# Patient Record
Sex: Female | Born: 1965 | Race: White | Hispanic: No | Marital: Married | Smoking: Never smoker
Health system: Southern US, Community
[De-identification: ages and names within clinical notes are randomized; demographics above are authoritative.]

## PROBLEM LIST (undated history)

## (undated) DIAGNOSIS — R112 Nausea with vomiting, unspecified: Secondary | ICD-10-CM

## (undated) DIAGNOSIS — Z9889 Other specified postprocedural states: Secondary | ICD-10-CM

## (undated) DIAGNOSIS — F419 Anxiety disorder, unspecified: Secondary | ICD-10-CM

## (undated) DIAGNOSIS — Z90710 Acquired absence of both cervix and uterus: Secondary | ICD-10-CM

## (undated) DIAGNOSIS — R519 Headache, unspecified: Secondary | ICD-10-CM

## (undated) DIAGNOSIS — R51 Headache: Secondary | ICD-10-CM

## (undated) HISTORY — PX: BLADDER SURGERY: SHX569

## (undated) HISTORY — PX: NECK SURGERY: SHX720

## (undated) HISTORY — PX: CHOLECYSTECTOMY: SHX55

## (undated) HISTORY — PX: DILATION AND CURETTAGE OF UTERUS: SHX78

## (undated) HISTORY — PX: SHOULDER ARTHROSCOPY: SHX128

---

## 2006-08-18 ENCOUNTER — Encounter: Admission: RE | Admit: 2006-08-18 | Discharge: 2006-08-18 | Payer: Self-pay | Admitting: Obstetrics and Gynecology

## 2007-08-21 ENCOUNTER — Encounter: Admission: RE | Admit: 2007-08-21 | Discharge: 2007-08-21 | Payer: Self-pay | Admitting: Obstetrics & Gynecology

## 2008-08-15 ENCOUNTER — Encounter: Admission: RE | Admit: 2008-08-15 | Discharge: 2008-08-15 | Payer: Self-pay | Admitting: Anesthesiology

## 2008-08-21 ENCOUNTER — Encounter: Admission: RE | Admit: 2008-08-21 | Discharge: 2008-08-21 | Payer: Self-pay | Admitting: Obstetrics & Gynecology

## 2008-09-13 ENCOUNTER — Ambulatory Visit (HOSPITAL_COMMUNITY): Admission: RE | Admit: 2008-09-13 | Discharge: 2008-09-13 | Payer: Self-pay | Admitting: Neurological Surgery

## 2008-10-15 ENCOUNTER — Encounter: Admission: RE | Admit: 2008-10-15 | Discharge: 2008-10-15 | Payer: Self-pay | Admitting: Neurological Surgery

## 2008-12-16 ENCOUNTER — Encounter: Admission: RE | Admit: 2008-12-16 | Discharge: 2008-12-16 | Payer: Self-pay | Admitting: Neurological Surgery

## 2009-03-15 ENCOUNTER — Encounter: Admission: RE | Admit: 2009-03-15 | Discharge: 2009-03-15 | Payer: Self-pay | Admitting: Neurological Surgery

## 2009-03-17 ENCOUNTER — Encounter: Admission: RE | Admit: 2009-03-17 | Discharge: 2009-03-17 | Payer: Self-pay | Admitting: Neurological Surgery

## 2009-09-15 ENCOUNTER — Encounter: Admission: RE | Admit: 2009-09-15 | Discharge: 2009-09-15 | Payer: Self-pay | Admitting: Obstetrics & Gynecology

## 2010-04-28 ENCOUNTER — Encounter: Admission: RE | Admit: 2010-04-28 | Discharge: 2010-04-28 | Payer: Self-pay | Admitting: Internal Medicine

## 2010-06-12 IMAGING — CR DG CERVICAL SPINE 1V
1 series · 1 of 1 positions shown · non-contrast
Comparison: [HOSPITAL] cervical spine radiograph
09/13/2008 and [HOSPITAL] at [REDACTED] [HOSPITAL] cervical spine
radiograph 10/15/2008.

CLINICAL DATA: Surgery 09/13/2008 with neck pain.

CERVICAL SPINE - 1 VIEW

[w c-spine lat]
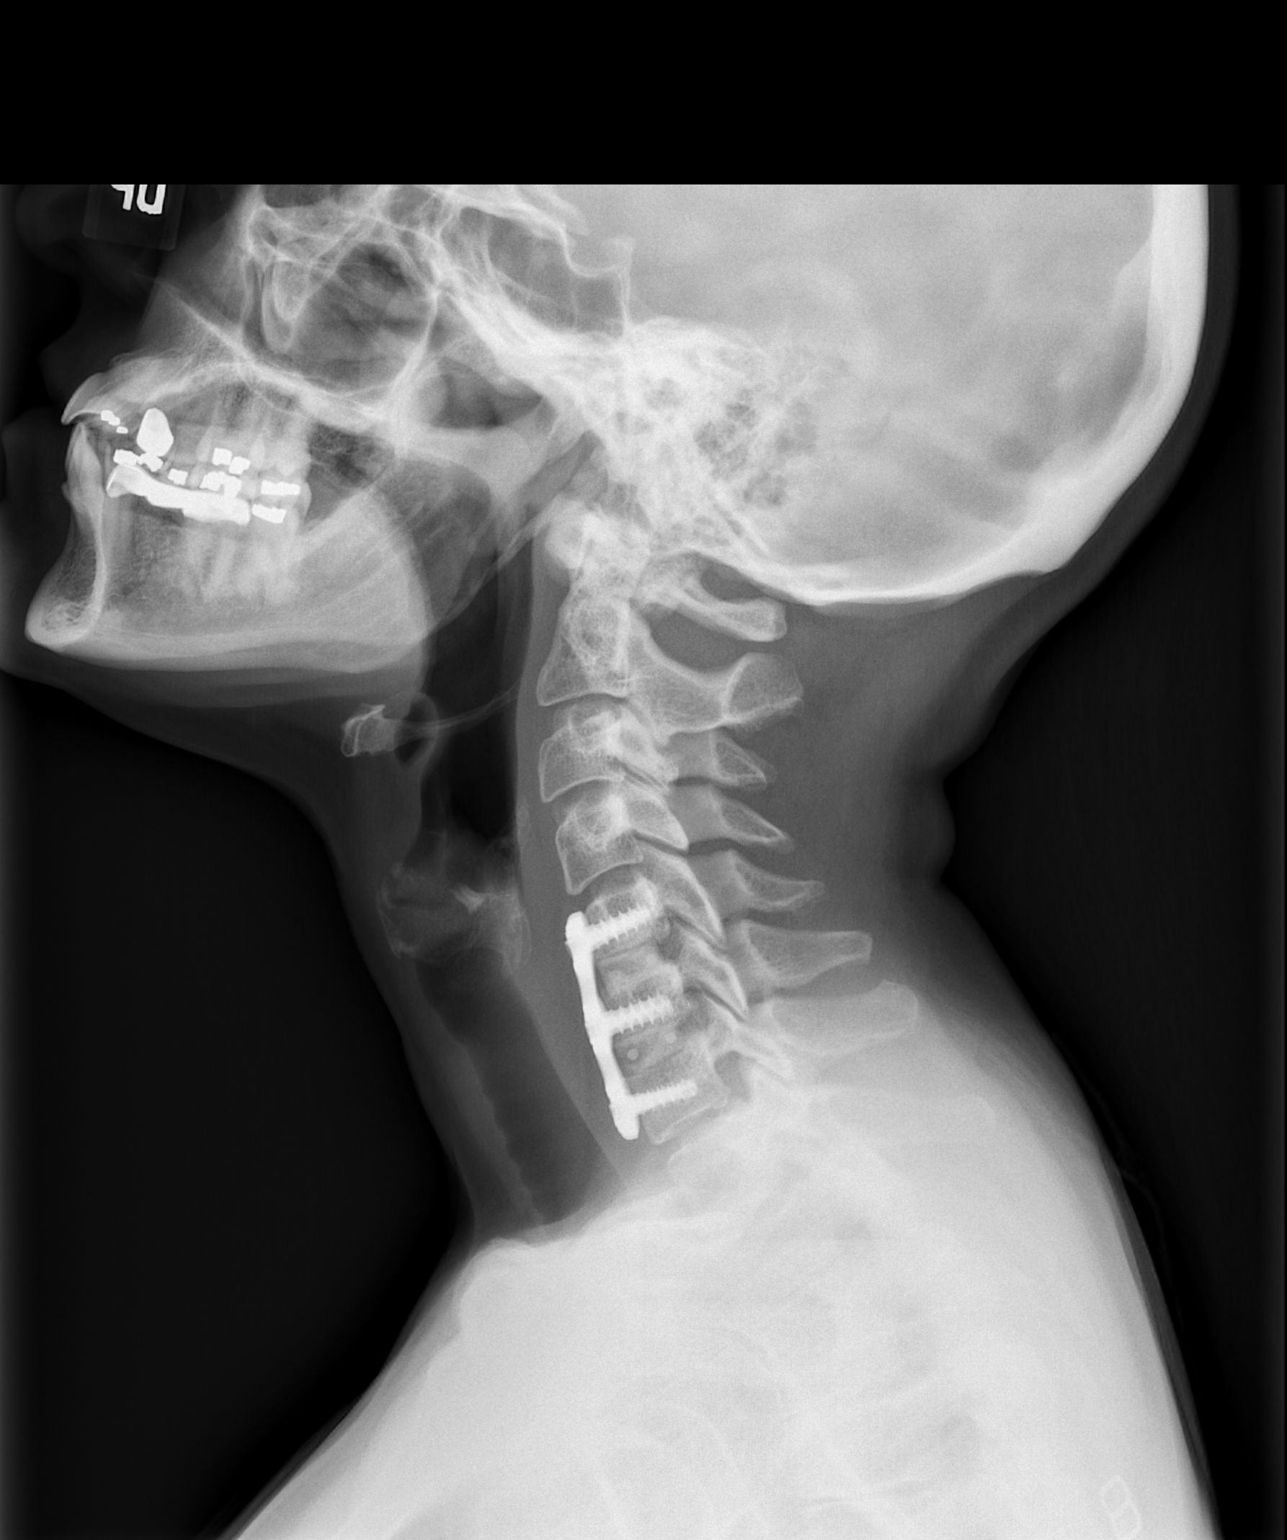

[1 of 1 positions shown; findings below may reference images not displayed]

FINDINGS: Stable satisfactory appearing anterior cervical
discectomy fusion hardware and interbody bone plugs C5-C7
visualized.  Remaining cervical disc spaces and vertebral alignment
normally maintained.  No abnormal prevertebral soft tissue swelling
or new acute findings visualized.
IMPRESSION: 1.  Stable satisfactory appearing anterior cervical discectomy
fusion C5-C7.
2.  No acute findings.

## 2010-09-21 ENCOUNTER — Encounter
Admission: RE | Admit: 2010-09-21 | Discharge: 2010-09-21 | Payer: Self-pay | Source: Home / Self Care | Attending: Obstetrics & Gynecology | Admitting: Obstetrics & Gynecology

## 2010-12-16 ENCOUNTER — Other Ambulatory Visit: Payer: Self-pay | Admitting: Internal Medicine

## 2010-12-16 DIAGNOSIS — R1011 Right upper quadrant pain: Secondary | ICD-10-CM

## 2010-12-18 ENCOUNTER — Ambulatory Visit
Admission: RE | Admit: 2010-12-18 | Discharge: 2010-12-18 | Disposition: A | Discharge status: Home / Self Care | Payer: 59 | Source: Ambulatory Visit | Attending: Internal Medicine | Admitting: Internal Medicine

## 2010-12-18 DIAGNOSIS — R1011 Right upper quadrant pain: Secondary | ICD-10-CM

## 2011-02-16 NOTE — Op Note (Signed)
Brianna Cook, Brianna Cook             ACCOUNT NO.:  [id number]   MEDICAL RECORD NO.:  [id number]          PATIENT TYPE:  OIB   LOCATION:  3526                         FACILITY:  [facility]   PHYSICIAN:  Tia Alert, MD     DATE OF BIRTH:  [birth year]   DATE OF PROCEDURE:  09/13/2008  DATE OF DISCHARGE:  09/13/2008                               OPERATIVE REPORT   PREOPERATIVE DIAGNOSES:  Cervical spondylosis with degenerative disk  disease and spinal stenosis, C5-6 and C6-7 with neck and left arm pain.   POSTOPERATIVE DIAGNOSES:  Cervical spondylosis with degenerative disk  disease and spinal stenosis, C5-6 and C6-7 with neck and left arm pain.   PROCEDURE:  1. Decompressive anterior cervical diskectomy, C5-6, C6-7 for central      canal and left C6 and C7 nerve root decompression.  2. Anterior cervical arthrodesis, C5-6 and C6-7 utilizing 7-mm      corticocancellous allograft.  3. Anterior cervical plating, C5-C7 utilizing a 40-mm Atlantis Venture      plate.   SURGEON:  Tia Alert, MD   ASSISTANT:  Reinaldo Meeker, MD   ANESTHESIA:  General endotracheal.   COMPLICATIONS:  None apparent.   INDICATIONS FOR PROCEDURE:  Ms. Riedlinger is a very pleasant 45 year old  female who is referred with neck and left arm pain.  She had an MRI,  which showed significant spondylosis with degenerative disk disease and  neural foraminal stenosis as well as central canal stenosis at C5-6 and  C6-7.  We discussed treatment options.  She wished to proceed with ACDF  and plating at C5-6, C6-7 in hopes of improving her pain syndrome.  She  understood the risks, the benefits and expected outcome, and wished to  proceed.   DESCRIPTION OF PROCEDURE:  The patient was taken to the operating room  and after induction of adequate generalized endotracheal anesthesia, she  was placed in a supine position on the operating room table.  Her right  anterior cervical region was prepped with DuraPrep and draped  in usual  sterile fashion.  A 5 mL of local anesthesia was injected and a  transverse incision was made to the right midline and carried down to  the platysma, which was elevated, opened, and undermined with Metzenbaum  scissors.  I then dissected the plane medial to the sternocleidomastoid  muscle and internal carotid artery and lateral to the trachea and  esophagus to expose C5-6 and C6-7.  Intraoperative fluoroscopy confirmed  my level and then the longus colli muscles were taken down and the  Shadow-Line retractors were placed under this to expose C5-6, C6-7.  The  annulus at these levels were incised and an initial diskectomy was done  with pituitary rongeurs and curved Karlin curettes.  I then used a high-  speed drill to prepare the endplates by drilling the endplates down to  the level of the posterior longitudinal ligament.  The height of the  disk space was heightened with the drill to 7 mm.  I then drilled in a  rectangular fashion, I then brought in the operating microscope and I  opened the posterior longitudinal ligament with a nerve hook at each  level and then used the Kerrison punch to undercut the bodies of C5-C6  and C6-C7 at the 2 levels.  I angled the microscope up and down to look  up under the bodies as best I could and bite up under each vertebral  body until the dura was full and capacious all the way across.  I  performed foraminotomies on the patient's left side, identified the C6  and the C7 nerve roots, by marching along the pedicle, identifying the  nerve root, and decompressing it distally into its neural foramina.  I  then palpated with nerve hook in a circumferential fashion to assure  adequate decompression of the central canal and the left-sided nerve  roots.  I could see the cord pulsatile through the dura.  I then  irrigated with saline solution and measured the interspace to be 7 mm  and I used 7-mm corticocancellous allografts and tapped these into   position at C5-6 and C6-7.  I then used a 40-mm Atlantis Venture plate  and placed two 13-mm variable angle screws in the bodies of C5, C6, and  C7, and then locked these into plate by locking mechanism within the  plate.  I then irrigated with saline solution containing bacitracin,  dried all bleeding points with bipolar cautery and I irrigated the  surgery foam away.  Once meticulous hemostasis was achieved, I closed  the platysma with 3-0 Vicryl, with the subcuticular tissue with 3-0  Vicryl, and the skin with Benzoin and Steri-Strips.  The drapes removed.  Sterile dressing was applied.  The patient was awakened from general  anesthesia and transferred to recovery room in stable addition.  At the  end of the procedure, all sponge, needle, and instrument counts were  correct.       Tia Alert, MD  Electronically Signed     DSJ/MEDQ  D:  09/13/2008  T:  09/14/2008  Job:  [id number]

## 2011-04-22 ENCOUNTER — Ambulatory Visit (HOSPITAL_COMMUNITY)
Admission: RE | Admit: 2011-04-22 | Discharge: 2011-04-22 | Disposition: A | Discharge status: Home / Self Care | Payer: 59 | Source: Ambulatory Visit | Attending: Gastroenterology | Admitting: Gastroenterology

## 2011-04-22 ENCOUNTER — Other Ambulatory Visit: Payer: Self-pay | Admitting: Gastroenterology

## 2011-04-22 DIAGNOSIS — N200 Calculus of kidney: Secondary | ICD-10-CM | POA: Insufficient documentation

## 2011-04-22 DIAGNOSIS — D126 Benign neoplasm of colon, unspecified: Secondary | ICD-10-CM | POA: Insufficient documentation

## 2011-04-22 DIAGNOSIS — R197 Diarrhea, unspecified: Secondary | ICD-10-CM | POA: Insufficient documentation

## 2011-04-22 DIAGNOSIS — R109 Unspecified abdominal pain: Secondary | ICD-10-CM | POA: Insufficient documentation

## 2011-04-22 DIAGNOSIS — Z9089 Acquired absence of other organs: Secondary | ICD-10-CM | POA: Insufficient documentation

## 2011-05-06 NOTE — Op Note (Signed)
  NAMEGARRETT, [name]             ACCOUNT NO.:  [id number]  MEDICAL RECORD NO.:  [id number]  LOCATION:  WLEN                         FACILITY:  [facility]  PHYSICIAN:  Danise Edge, M.D.   DATE OF BIRTH:  10-01-1966  DATE OF PROCEDURE:  04/22/2011 DATE OF DISCHARGE:                              OPERATIVE REPORT   PROCEDURE:  Diagnostic colonoscopy.  REFERRING PHYSICIAN:  Pearla Dubonnet, M.D.  HISTORY:  Ms. Brianna Cook is a 45 year old female, born 08-12-1966.  The patient is experiencing predominantly intermittent upper abdominal pain, associated with nonbloody diarrhea.  She underwent a normal esophagogastroduodenoscopy with gastric biopsies.  She underwent a normal CT scan of the abdomen and pelvis, except for small left kidney stones.  She underwent a laparoscopic cholecystectomy for gallstones approximately 1 year ago.  ENDOSCOPIST:  Danise Edge, M.D.  PREMEDICATION:  Propofol administered by Anesthesia.  PROCEDURE:  The patient was placed in the left lateral decubitus position.  Anal inspection and digital rectal exam were normal.  The Pentax pediatric colonoscope was introduced into the rectum and advanced to the cecum.  Due to colonic loop formation, advancement of the colonoscope was somewhat difficult.  Colonic preparation for the exam today was good.  Rectum normal.  Retroflex view of the distal rectum normal.  Sigmoid colon.  From the proximal sigmoid colon, a 5-mm sessile polyp was removed with cold snare.  Descending colon normal.  Splenic flexure normal.  Transverse colon normal.  Hepatic flexure normal.  Ascending colon normal.  Cecum and ileocecal valve normal.  Terminal ileum normal.  RANDOM BIOPSIES:  Random colon biopsies were performed from the right colon and left colon to look for microscopic colitis.  ASSESSMENT: 1. A small polyp was removed from the sigmoid colon with cold snare. 2. Random colon biopsies were  performed to look for microscopic     colitis. 3. Otherwise normal proctocolonoscopy to the cecum with inspection of     the terminal ileum.  DIFFERENTIAL DIAGNOSIS:  Irritable bowel syndrome, microscopic colitis, and bile acid leak, diarrhea postcholecystectomy.          ______________________________ Danise Edge, M.D.     MJ/MEDQ  D:  04/22/2011  T:  04/22/2011  Job:  [id number]  cc:   Pearla Dubonnet, M.D. Fax: [phone]  Electronically Signed by Danise Edge M.D. on 05/06/2011 04:14:04 PM

## 2011-07-09 LAB — DIFFERENTIAL
Basophils Absolute: 0 10*3/uL (ref 0.0–0.1)
Basophils Relative: 1 % (ref 0–1)
Eosinophils Relative: 1 % (ref 0–5)
Lymphocytes Relative: 26 % (ref 12–46)

## 2011-07-09 LAB — CBC
HCT: 38.4 % (ref 36.0–46.0)
MCHC: 33.8 g/dL (ref 30.0–36.0)
Platelets: 207 10*3/uL (ref 150–400)
RDW: 12.1 % (ref 11.5–15.5)

## 2011-07-09 LAB — BASIC METABOLIC PANEL
BUN: 10 mg/dL (ref 6–23)
CO2: 28 mEq/L (ref 19–32)
Calcium: 9.6 mg/dL (ref 8.4–10.5)
GFR calc non Af Amer: >60 mL/min (ref >60)
Glucose, Bld: 143 mg/dL — ABNORMAL HIGH (ref 70–99)
Potassium: 3.6 mEq/L (ref 3.5–5.1)

## 2011-07-09 LAB — PROTIME-INR: Prothrombin Time: 12.8 seconds (ref 11.6–15.2)

## 2011-08-20 ENCOUNTER — Other Ambulatory Visit: Payer: Self-pay | Admitting: Family Medicine

## 2011-08-20 ENCOUNTER — Other Ambulatory Visit: Payer: Self-pay | Admitting: Obstetrics & Gynecology

## 2011-08-20 DIAGNOSIS — Z1231 Encounter for screening mammogram for malignant neoplasm of breast: Secondary | ICD-10-CM

## 2011-09-23 ENCOUNTER — Ambulatory Visit: Payer: 59

## 2011-10-11 ENCOUNTER — Ambulatory Visit
Admission: RE | Admit: 2011-10-11 | Discharge: 2011-10-11 | Disposition: A | Discharge status: Home / Self Care | Payer: 59 | Source: Ambulatory Visit | Attending: Obstetrics & Gynecology | Admitting: Obstetrics & Gynecology

## 2011-10-11 DIAGNOSIS — Z1231 Encounter for screening mammogram for malignant neoplasm of breast: Secondary | ICD-10-CM

## 2012-10-11 ENCOUNTER — Other Ambulatory Visit: Payer: Self-pay | Admitting: Obstetrics & Gynecology

## 2012-10-11 DIAGNOSIS — Z1231 Encounter for screening mammogram for malignant neoplasm of breast: Secondary | ICD-10-CM

## 2012-10-11 DIAGNOSIS — Z9882 Breast implant status: Secondary | ICD-10-CM

## 2012-12-15 ENCOUNTER — Ambulatory Visit
Admission: RE | Admit: 2012-12-15 | Discharge: 2012-12-15 | Disposition: A | Discharge status: Home / Self Care | Payer: 59 | Source: Ambulatory Visit | Attending: Obstetrics & Gynecology | Admitting: Obstetrics & Gynecology

## 2012-12-15 DIAGNOSIS — Z9882 Breast implant status: Secondary | ICD-10-CM

## 2012-12-15 DIAGNOSIS — Z1231 Encounter for screening mammogram for malignant neoplasm of breast: Secondary | ICD-10-CM

## 2013-08-08 ENCOUNTER — Other Ambulatory Visit: Payer: Self-pay | Admitting: Internal Medicine

## 2013-08-08 ENCOUNTER — Ambulatory Visit
Admission: RE | Admit: 2013-08-08 | Discharge: 2013-08-08 | Disposition: A | Discharge status: Home / Self Care | Payer: 59 | Source: Ambulatory Visit | Attending: Internal Medicine | Admitting: Internal Medicine

## 2013-08-08 DIAGNOSIS — R319 Hematuria, unspecified: Secondary | ICD-10-CM

## 2013-11-28 ENCOUNTER — Other Ambulatory Visit: Payer: Self-pay

## 2013-11-28 DIAGNOSIS — Z1231 Encounter for screening mammogram for malignant neoplasm of breast: Secondary | ICD-10-CM

## 2013-12-17 ENCOUNTER — Ambulatory Visit
Admission: RE | Admit: 2013-12-17 | Discharge: 2013-12-17 | Disposition: A | Discharge status: Home / Self Care | Payer: 59 | Source: Ambulatory Visit

## 2013-12-17 DIAGNOSIS — Z1231 Encounter for screening mammogram for malignant neoplasm of breast: Secondary | ICD-10-CM

## 2014-06-03 ENCOUNTER — Ambulatory Visit
Admission: RE | Admit: 2014-06-03 | Discharge: 2014-06-03 | Disposition: A | Discharge status: Home / Self Care | Payer: 59 | Source: Ambulatory Visit | Attending: Internal Medicine | Admitting: Internal Medicine

## 2014-06-03 ENCOUNTER — Other Ambulatory Visit: Payer: Self-pay | Admitting: Internal Medicine

## 2014-06-03 DIAGNOSIS — M5489 Other dorsalgia: Secondary | ICD-10-CM

## 2014-09-10 ENCOUNTER — Other Ambulatory Visit: Payer: Self-pay | Admitting: Obstetrics and Gynecology

## 2014-10-03 NOTE — Patient Instructions (Signed)
   Your procedure is scheduled on:  Thursday, Jan 7  Enter through the [employer] [facility] at: 11:30 Am Pick up the phone at the desk and dial [phone] and inform us of your arrival.  Please call this number if you have any problems the morning of surgery: [phone]  Remember: Do not eat food after midnight: Wednesday Do not drink clear liquids after: 6 AM Thursday, day of surgery Take these medicines the morning of surgery with a SIP OF WATER:  Do not wear jewelry, make-up, or FINGER nail polish No metal in your hair or on your body. Do not wear lotions, powders, perfumes.  You may wear deodorant.  Do not bring valuables to the hospital. Contacts, dentures or bridgework may not be worn into surgery.  Leave suitcase in the car. After Surgery it may be brought to your room. For patients being admitted to the hospital, checkout time is 11:00am the day of discharge.

## 2014-10-07 ENCOUNTER — Encounter (HOSPITAL_COMMUNITY): Payer: Self-pay

## 2014-10-07 ENCOUNTER — Encounter (HOSPITAL_COMMUNITY)
Admission: RE | Admit: 2014-10-07 | Discharge: 2014-10-07 | Disposition: A | Discharge status: Home / Self Care | Payer: 59 | Source: Ambulatory Visit | Attending: Obstetrics and Gynecology | Admitting: Obstetrics and Gynecology

## 2014-10-07 DIAGNOSIS — N946 Dysmenorrhea, unspecified: Secondary | ICD-10-CM | POA: Diagnosis not present

## 2014-10-07 DIAGNOSIS — N736 Female pelvic peritoneal adhesions (postinfective): Secondary | ICD-10-CM | POA: Diagnosis not present

## 2014-10-07 DIAGNOSIS — N920 Excessive and frequent menstruation with regular cycle: Secondary | ICD-10-CM | POA: Diagnosis present

## 2014-10-07 DIAGNOSIS — Z885 Allergy status to narcotic agent status: Secondary | ICD-10-CM | POA: Diagnosis not present

## 2014-10-07 DIAGNOSIS — Z882 Allergy status to sulfonamides status: Secondary | ICD-10-CM | POA: Diagnosis not present

## 2014-10-07 HISTORY — DX: Anxiety disorder, unspecified: F41.9

## 2014-10-07 HISTORY — DX: Other specified postprocedural states: Z98.890

## 2014-10-07 HISTORY — DX: Nausea with vomiting, unspecified: R11.2

## 2014-10-07 LAB — CBC
HCT: 38.6 % (ref 36.0–46.0)
Hemoglobin: 12.8 g/dL (ref 12.0–15.0)
MCH: 30.7 pg (ref 26.0–34.0)
MCHC: 33.2 g/dL (ref 30.0–36.0)
MCV: 92.6 fL (ref 78.0–100.0)
PLATELETS: 212 10*3/uL (ref 150–400)
RBC: 4.17 MIL/uL (ref 3.87–5.11)
RDW: 12.8 % (ref 11.5–15.5)
WBC: 5.4 10*3/uL (ref 4.0–10.5)

## 2014-10-07 LAB — TYPE AND SCREEN
ABO/RH(D): A POS
Antibody Screen: NEGATIVE

## 2014-10-07 LAB — ABO/RH: ABO/RH(D): A POS

## 2014-10-09 NOTE — H&P (Signed)
Brianna Cook, Brianna Cook             ACCOUNT NO.:  [id number]  MEDICAL RECORD NO.:  [id number]  LOCATION:  PERIO                         FACILITY:  [facility]  PHYSICIAN:  Lovenia Kim, M.D.DATE OF BIRTH:  Feb 12, 1966  DATE OF ADMISSION:  09/09/2014 DATE OF DISCHARGE:                             HISTORY & PHYSICAL   CHIEF COMPLAINT:  Dysmenorrhea and menorrhagia with failed EAB  HISTORY OF PRESENT ILLNESS:  A 49 year old female with failed EAB, dysmenorrhea and  menorrhagia, for definitive therapy.  MEDICATIONS:  Include as noted.  ALLERGIES:  SULFA, CODEINE.  SOCIAL HISTORY:  She is a nonsmoker, nondrinker.  She denies domestic physical violence.  PREGNANCY HISTORY:  Three vaginal deliveries.  PAST SURGICAL HISTORY:  Endometrial biopsy nl, tubal ligation, shoulder surgery, and cholecystectomy.  FAMILY HISTORY:  Diabetes, heart disease, and hypertension.  PHYSICAL EXAMINATION:  GENERAL:  A well-developed, well-nourished female, in no acute distress. HEENT:  Normal. NECK:  Supple. FROM. LUNGS:  Clear. HEART:  Regular rate and rhythm. ABDOMEN:  Soft, nontender. PELVIC:  Reveals a bulky anteflexed uterus, and no adnexal masses. EXTREMITIES:  Reveal no cords. NEUROLOGIC:  Nonfocal. SKIN:  Intact.  IMPRESSION:  Symptomatic dysmenorrhea and menorrhagia with failed endometrial ablation.  PLAN:  Proceed with da Vinci-assisted total laparoscopic hysterectomy, bilateral salpingectomy.  Risks of anesthesia, infection, bleeding, injury to surrounding organs, possible need for repair were discussed. Delayed versus immediate complications to include bowel and bladder injury noted.  The patient acknowledges and wishes to proceed.     Lovenia Kim, M.D.     RJT/MEDQ  D:  10/09/2014  T:  10/09/2014  Job:  [id number]

## 2014-10-10 ENCOUNTER — Encounter (HOSPITAL_COMMUNITY): Payer: Self-pay | Admitting: Certified Registered Nurse Anesthetist

## 2014-10-10 ENCOUNTER — Ambulatory Visit (HOSPITAL_COMMUNITY): Payer: 59 | Admitting: Certified Registered Nurse Anesthetist

## 2014-10-10 ENCOUNTER — Ambulatory Visit (HOSPITAL_COMMUNITY)
Admission: RE | Admit: 2014-10-10 | Discharge: 2014-10-11 | Disposition: A | Discharge status: Home / Self Care | Payer: 59 | Source: Ambulatory Visit | Attending: Obstetrics and Gynecology | Admitting: Obstetrics and Gynecology

## 2014-10-10 ENCOUNTER — Encounter (HOSPITAL_COMMUNITY)
Admission: RE | Disposition: A | Discharge status: Home / Self Care | Payer: Self-pay | Source: Ambulatory Visit | Attending: Obstetrics and Gynecology

## 2014-10-10 DIAGNOSIS — N920 Excessive and frequent menstruation with regular cycle: Secondary | ICD-10-CM | POA: Diagnosis not present

## 2014-10-10 DIAGNOSIS — N946 Dysmenorrhea, unspecified: Secondary | ICD-10-CM | POA: Insufficient documentation

## 2014-10-10 DIAGNOSIS — Z885 Allergy status to narcotic agent status: Secondary | ICD-10-CM | POA: Insufficient documentation

## 2014-10-10 DIAGNOSIS — Z90710 Acquired absence of both cervix and uterus: Secondary | ICD-10-CM

## 2014-10-10 DIAGNOSIS — Z882 Allergy status to sulfonamides status: Secondary | ICD-10-CM | POA: Insufficient documentation

## 2014-10-10 DIAGNOSIS — N736 Female pelvic peritoneal adhesions (postinfective): Secondary | ICD-10-CM | POA: Insufficient documentation

## 2014-10-10 HISTORY — DX: Acquired absence of both cervix and uterus: Z90.710

## 2014-10-10 HISTORY — PX: ROBOTIC ASSISTED TOTAL HYSTERECTOMY: SHX6085

## 2014-10-10 HISTORY — PX: BILATERAL SALPINGECTOMY: SHX5743

## 2014-10-10 LAB — HCG, SERUM, QUALITATIVE: Preg, Serum: NEGATIVE

## 2014-10-10 SURGERY — ROBOTIC ASSISTED TOTAL HYSTERECTOMY
Anesthesia: General | Site: Abdomen

## 2014-10-10 MED ORDER — TRAMADOL HCL 50 MG PO TABS
50.0000 mg | ORAL_TABLET | Freq: Four times a day (QID) | ORAL | Status: DC | PRN
  Administered 2014-10-11: 50 mg via ORAL
  Filled 2014-10-10: qty 1

## 2014-10-10 MED ORDER — GLYCOPYRROLATE 0.2 MG/ML IJ SOLN
INTRAMUSCULAR | Status: AC
  Filled 2014-10-10: qty 3

## 2014-10-10 MED ORDER — DIPHENHYDRAMINE HCL 12.5 MG/5ML PO ELIX: 12.5000 mg | ORAL_SOLUTION | Freq: Four times a day (QID) | ORAL | Status: DC | PRN

## 2014-10-10 MED ORDER — FENTANYL CITRATE 0.05 MG/ML IJ SOLN
INTRAMUSCULAR | Status: AC
Start: 2014-10-10 — End: 2014-10-10
  Filled 2014-10-10: qty 5

## 2014-10-10 MED ORDER — SCOPOLAMINE 1 MG/3DAYS TD PT72
MEDICATED_PATCH | TRANSDERMAL | Status: DC
Start: 2014-10-10 — End: 2014-10-11
  Administered 2014-10-10: 1.5 mg via TRANSDERMAL
  Filled 2014-10-10: qty 1

## 2014-10-10 MED ORDER — SODIUM CHLORIDE 0.9 % IJ SOLN: 9.0000 mL | INTRAMUSCULAR | Status: DC | PRN

## 2014-10-10 MED ORDER — HYDROMORPHONE HCL 1 MG/ML IJ SOLN
INTRAMUSCULAR | Status: DC | PRN
  Administered 2014-10-10: 0.5 mg via INTRAVENOUS

## 2014-10-10 MED ORDER — SODIUM CHLORIDE 0.9 % IV SOLN
INTRAVENOUS | Status: DC | PRN
  Administered 2014-10-10: 120 mL

## 2014-10-10 MED ORDER — ROPIVACAINE HCL 5 MG/ML IJ SOLN
INTRAMUSCULAR | Status: AC
  Filled 2014-10-10: qty 60

## 2014-10-10 MED ORDER — LIDOCAINE HCL (PF) 1 % IJ SOLN
INTRAMUSCULAR | Status: AC
  Filled 2014-10-10: qty 5

## 2014-10-10 MED ORDER — ONDANSETRON HCL 4 MG/2ML IJ SOLN
INTRAMUSCULAR | Status: DC | PRN
  Administered 2014-10-10: 4 mg via INTRAVENOUS

## 2014-10-10 MED ORDER — ONDANSETRON HCL 4 MG/2ML IJ SOLN: 4.0000 mg | Freq: Four times a day (QID) | INTRAMUSCULAR | Status: DC | PRN

## 2014-10-10 MED ORDER — NEOSTIGMINE METHYLSULFATE 10 MG/10ML IV SOLN
INTRAVENOUS | Status: AC
  Filled 2014-10-10: qty 1

## 2014-10-10 MED ORDER — MIDAZOLAM HCL 2 MG/2ML IJ SOLN: 0.5000 mg | Freq: Once | INTRAMUSCULAR | Status: DC | PRN

## 2014-10-10 MED ORDER — DEXAMETHASONE SODIUM PHOSPHATE 10 MG/ML IJ SOLN
INTRAMUSCULAR | Status: DC | PRN
  Administered 2014-10-10: 4 mg via INTRAVENOUS

## 2014-10-10 MED ORDER — ACETAMINOPHEN 160 MG/5ML PO SOLN: 325.0000 mg | ORAL | Status: DC | PRN

## 2014-10-10 MED ORDER — PROPOFOL 10 MG/ML IV BOLUS
INTRAVENOUS | Status: AC
  Filled 2014-10-10: qty 40

## 2014-10-10 MED ORDER — FENTANYL CITRATE 0.05 MG/ML IJ SOLN
INTRAMUSCULAR | Status: DC | PRN
  Administered 2014-10-10 (×3): 50 ug via INTRAVENOUS
  Administered 2014-10-10: 100 ug via INTRAVENOUS

## 2014-10-10 MED ORDER — CEFAZOLIN SODIUM-DEXTROSE 2-3 GM-% IV SOLR
INTRAVENOUS | Status: AC
  Filled 2014-10-10: qty 50

## 2014-10-10 MED ORDER — MIDAZOLAM HCL 2 MG/2ML IJ SOLN
INTRAMUSCULAR | Status: DC | PRN
  Administered 2014-10-10: 2 mg via INTRAVENOUS

## 2014-10-10 MED ORDER — CEFAZOLIN SODIUM-DEXTROSE 2-3 GM-% IV SOLR
2.0000 g | INTRAVENOUS | Status: AC
  Administered 2014-10-10: 2 g via INTRAVENOUS

## 2014-10-10 MED ORDER — OXYCODONE-ACETAMINOPHEN 5-325 MG PO TABS: 1.0000 | ORAL_TABLET | ORAL | Status: DC | PRN

## 2014-10-10 MED ORDER — MEPERIDINE HCL 25 MG/ML IJ SOLN: 6.2500 mg | INTRAMUSCULAR | Status: DC | PRN

## 2014-10-10 MED ORDER — NEOSTIGMINE METHYLSULFATE 10 MG/10ML IV SOLN
INTRAVENOUS | Status: DC | PRN
Start: 2014-10-10 — End: 2014-10-10
  Administered 2014-10-10: 3 mg via INTRAVENOUS

## 2014-10-10 MED ORDER — KETOROLAC TROMETHAMINE 30 MG/ML IJ SOLN: 30.0000 mg | Freq: Once | INTRAMUSCULAR | Status: DC | PRN

## 2014-10-10 MED ORDER — SODIUM CHLORIDE 0.9 % IJ SOLN
INTRAMUSCULAR | Status: AC
  Filled 2014-10-10: qty 10

## 2014-10-10 MED ORDER — NALOXONE HCL 0.4 MG/ML IJ SOLN: 0.4000 mg | INTRAMUSCULAR | Status: DC | PRN

## 2014-10-10 MED ORDER — LIDOCAINE HCL (CARDIAC) 20 MG/ML IV SOLN
INTRAVENOUS | Status: DC | PRN
  Administered 2014-10-10: 30 mg via INTRAVENOUS

## 2014-10-10 MED ORDER — DEXTROSE IN LACTATED RINGERS 5 % IV SOLN
INTRAVENOUS | Status: DC
  Administered 2014-10-11: via INTRAVENOUS

## 2014-10-10 MED ORDER — FENTANYL CITRATE 0.05 MG/ML IJ SOLN
25.0000 ug | INTRAMUSCULAR | Status: DC | PRN
  Administered 2014-10-10: 50 ug via INTRAVENOUS

## 2014-10-10 MED ORDER — HYDROMORPHONE HCL 1 MG/ML IJ SOLN
INTRAMUSCULAR | Status: AC
  Filled 2014-10-10: qty 1

## 2014-10-10 MED ORDER — GLYCOPYRROLATE 0.2 MG/ML IJ SOLN
INTRAMUSCULAR | Status: DC | PRN
  Administered 2014-10-10: 0.4 mg via INTRAVENOUS

## 2014-10-10 MED ORDER — LACTATED RINGERS IR SOLN
Status: DC | PRN
  Administered 2014-10-10: 3000 mL

## 2014-10-10 MED ORDER — PROPOFOL 10 MG/ML IV BOLUS
INTRAVENOUS | Status: DC | PRN
  Administered 2014-10-10: 200 mg via INTRAVENOUS

## 2014-10-10 MED ORDER — HYDROMORPHONE 0.3 MG/ML IV SOLN
INTRAVENOUS | Status: DC
  Administered 2014-10-10: 18:00:00 via INTRAVENOUS
  Administered 2014-10-10: 0.2 mg via INTRAVENOUS
  Administered 2014-10-11: 0.4 mg via INTRAVENOUS
  Administered 2014-10-11: 0.599 mg via INTRAVENOUS
  Filled 2014-10-10: qty 25

## 2014-10-10 MED ORDER — ACETAMINOPHEN 325 MG PO TABS: 325.0000 mg | ORAL_TABLET | ORAL | Status: DC | PRN

## 2014-10-10 MED ORDER — ARTIFICIAL TEARS OP OINT
TOPICAL_OINTMENT | OPHTHALMIC | Status: AC
  Filled 2014-10-10: qty 3.5

## 2014-10-10 MED ORDER — KETOROLAC TROMETHAMINE 30 MG/ML IJ SOLN
INTRAMUSCULAR | Status: DC | PRN
  Administered 2014-10-10: 30 mg via INTRAVENOUS

## 2014-10-10 MED ORDER — KETOROLAC TROMETHAMINE 30 MG/ML IJ SOLN
INTRAMUSCULAR | Status: AC
  Filled 2014-10-10: qty 1

## 2014-10-10 MED ORDER — SCOPOLAMINE 1 MG/3DAYS TD PT72
1.0000 | MEDICATED_PATCH | Freq: Once | TRANSDERMAL | Status: DC
Start: 2014-10-10 — End: 2014-10-10
  Administered 2014-10-10: 1.5 mg via TRANSDERMAL

## 2014-10-10 MED ORDER — PROMETHAZINE HCL 25 MG/ML IJ SOLN: 6.2500 mg | INTRAMUSCULAR | Status: DC | PRN

## 2014-10-10 MED ORDER — ROCURONIUM BROMIDE 100 MG/10ML IV SOLN
INTRAVENOUS | Status: DC | PRN
  Administered 2014-10-10: 50 mg via INTRAVENOUS
  Administered 2014-10-10: 10 mg via INTRAVENOUS

## 2014-10-10 MED ORDER — MIDAZOLAM HCL 2 MG/2ML IJ SOLN
INTRAMUSCULAR | Status: AC
  Filled 2014-10-10: qty 2

## 2014-10-10 MED ORDER — SODIUM CHLORIDE 0.9 % IJ SOLN
INTRAMUSCULAR | Status: AC
  Filled 2014-10-10: qty 50

## 2014-10-10 MED ORDER — ONDANSETRON HCL 4 MG/2ML IJ SOLN
INTRAMUSCULAR | Status: AC
  Filled 2014-10-10: qty 2

## 2014-10-10 MED ORDER — DEXAMETHASONE SODIUM PHOSPHATE 4 MG/ML IJ SOLN
INTRAMUSCULAR | Status: AC
  Filled 2014-10-10: qty 1

## 2014-10-10 MED ORDER — LACTATED RINGERS IV SOLN
INTRAVENOUS | Status: DC
Start: 2014-10-10 — End: 2014-10-10
  Administered 2014-10-10 (×3): via INTRAVENOUS

## 2014-10-10 MED ORDER — DIPHENHYDRAMINE HCL 50 MG/ML IJ SOLN: 12.5000 mg | Freq: Four times a day (QID) | INTRAMUSCULAR | Status: DC | PRN

## 2014-10-10 MED ORDER — FENTANYL CITRATE 0.05 MG/ML IJ SOLN
INTRAMUSCULAR | Status: AC
  Filled 2014-10-10: qty 2

## 2014-10-10 SURGICAL SUPPLY — 57 items
BARRIER ADHS 3X4 INTERCEED (GAUZE/BANDAGES/DRESSINGS) ×4 IMPLANT
BRR ADH 4X3 ABS CNTRL BYND (GAUZE/BANDAGES/DRESSINGS) ×2
CATH FOLEY 3WAY  5CC 16FR (CATHETERS) ×2
CATH FOLEY 3WAY 30CC 18FR (CATHETERS) ×2 IMPLANT
CATH FOLEY 3WAY 5CC 16FR (CATHETERS) ×2 IMPLANT
CLOTH BEACON ORANGE TIMEOUT ST (SAFETY) ×4 IMPLANT
CONT PATH 16OZ SNAP LID 3702 (MISCELLANEOUS) ×4 IMPLANT
COVER BACK TABLE 60X90IN (DRAPES) ×8 IMPLANT
COVER TIP SHEARS 8 DVNC (MISCELLANEOUS) ×2 IMPLANT
COVER TIP SHEARS 8MM DA VINCI (MISCELLANEOUS) ×2
DECANTER SPIKE VIAL GLASS SM (MISCELLANEOUS) ×16 IMPLANT
DRAPE WARM FLUID 44X44 (DRAPE) ×4 IMPLANT
DRSG OPSITE POSTOP 3X4 (GAUZE/BANDAGES/DRESSINGS) ×2 IMPLANT
DURAPREP 26ML APPLICATOR (WOUND CARE) ×4 IMPLANT
ELECT REM PT RETURN 9FT ADLT (ELECTROSURGICAL) ×4
ELECTRODE REM PT RTRN 9FT ADLT (ELECTROSURGICAL) ×2 IMPLANT
EVACUATOR SMOKE 8.L (FILTER) ×4 IMPLANT
GAUZE VASELINE 3X9 (GAUZE/BANDAGES/DRESSINGS) IMPLANT
GLOVE BIO SURGEON STRL SZ7.5 (GLOVE) ×8 IMPLANT
GLOVE BIOGEL PI IND STRL 7.0 (GLOVE) ×4 IMPLANT
GLOVE BIOGEL PI INDICATOR 7.0 (GLOVE) ×4
GOWN STRL REUS W/TWL LRG LVL3 (GOWN DISPOSABLE) ×40 IMPLANT
KIT ACCESSORY DA VINCI DISP (KITS) ×2
KIT ACCESSORY DVNC DISP (KITS) ×2 IMPLANT
LEGGING LITHOTOMY PAIR STRL (DRAPES) ×4 IMPLANT
LIQUID BAND (GAUZE/BANDAGES/DRESSINGS) ×4 IMPLANT
NEEDLE INSUFFLATION 150MM (ENDOMECHANICALS) ×4 IMPLANT
OCCLUDER COLPOPNEUMO (BALLOONS) ×2 IMPLANT
PACK ROBOT WH (CUSTOM PROCEDURE TRAY) ×4 IMPLANT
PAD POSITIONER PINK NONSTERILE (MISCELLANEOUS) ×4 IMPLANT
PAD PREP 24X48 CUFFED NSTRL (MISCELLANEOUS) ×8 IMPLANT
PROTECTOR NERVE ULNAR (MISCELLANEOUS) ×8 IMPLANT
SET CYSTO W/LG BORE CLAMP LF (SET/KITS/TRAYS/PACK) ×2 IMPLANT
SET IRRIG TUBING LAPAROSCOPIC (IRRIGATION / IRRIGATOR) ×4 IMPLANT
SET TRI-LUMEN FLTR TB AIRSEAL (TUBING) IMPLANT
SUT VIC AB 0 CT1 27 (SUTURE) ×4
SUT VIC AB 0 CT1 27XBRD ANBCTR (SUTURE) ×4 IMPLANT
SUT VICRYL 0 UR6 27IN ABS (SUTURE) ×4 IMPLANT
SUT VICRYL RAPIDE 4/0 PS 2 (SUTURE) ×8 IMPLANT
SUT VLOC 180 0 9IN  GS21 (SUTURE)
SUT VLOC 180 0 9IN GS21 (SUTURE) IMPLANT
SYR 50ML LL SCALE MARK (SYRINGE) ×4 IMPLANT
SYRINGE 10CC LL (SYRINGE) ×4 IMPLANT
TIP RUMI ORANGE 6.7MMX12CM (TIP) IMPLANT
TIP UTERINE 5.1X6CM LAV DISP (MISCELLANEOUS) IMPLANT
TIP UTERINE 6.7X10CM GRN DISP (MISCELLANEOUS) IMPLANT
TIP UTERINE 6.7X6CM WHT DISP (MISCELLANEOUS) IMPLANT
TIP UTERINE 6.7X8CM BLUE DISP (MISCELLANEOUS) ×2 IMPLANT
TOWEL OR 17X24 6PK STRL BLUE (TOWEL DISPOSABLE) ×12 IMPLANT
TROCAR DISP BLADELESS 8 DVNC (TROCAR) ×2 IMPLANT
TROCAR DISP BLADELESS 8MM (TROCAR) ×2
TROCAR PORT AIRSEAL 5X120 (TROCAR) ×2 IMPLANT
TROCAR XCEL 12X100 BLDLESS (ENDOMECHANICALS) IMPLANT
TROCAR XCEL NON-BLD 5MMX100MML (ENDOMECHANICALS) ×4 IMPLANT
TROCAR Z-THREAD 12X150 (TROCAR) ×4 IMPLANT
WARMER LAPAROSCOPE (MISCELLANEOUS) ×4 IMPLANT
WATER STERILE IRR 1000ML POUR (IV SOLUTION) ×12 IMPLANT

## 2014-10-10 NOTE — Op Note (Signed)
10/10/2014  3:07 PM  PATIENT:  Brianna Cook  49 y.o. female  PRE-OPERATIVE DIAGNOSIS:  Menorrhagia, Failed Endometrial Ablation  POST-OPERATIVE DIAGNOSIS:  menorrhagia, failed endometrial ablation PELVIC ADHESIONS  PROCEDURE:  Procedure(s): ROBOTIC ASSISTED TOTAL HYSTERECTOMY BILATERAL SALPINGECTOMY LYSIS OF SIGMOID AND LEFT OVARIAN ADHESIONS. MCCALL CUL DE PLASTY  SURGEON:  Surgeon(s): Lovenia Kim, MD  ASSISTANTS: Renato Battles, CNM   ANESTHESIA:   local and general  ESTIMATED BLOOD LOSS: 50CC  DRAINS: Urinary Catheter (Foley)   LOCAL MEDICATIONS USED:  XYLOCAINE   SPECIMEN:  Source of Specimen:  UTERUS , CERVIX AND TUBES  DISPOSITION OF SPECIMEN:  PATHOLOGY  COUNTS:  YES  DICTATION #: [id number]  PLAN OF CARE: 23 HR OBS  PATIENT DISPOSITION:  PACU - hemodynamically stable.

## 2014-10-10 NOTE — Transfer of Care (Signed)
Immediate Anesthesia Transfer of Care Note  Patient: Brianna Cook  Procedure(s) Performed: Procedure(s): ROBOTIC ASSISTED TOTAL HYSTERECTOMY (N/A) BILATERAL SALPINGECTOMY (Bilateral)  Patient Location: PACU  Anesthesia Type:General  Level of Consciousness: awake, alert , oriented and patient cooperative  Airway & Oxygen Therapy: Patient Spontanous Breathing and Patient connected to nasal cannula oxygen  Post-op Assessment: Report given to PACU RN and Post -op Vital signs reviewed and stable  Post vital signs: Reviewed and stable  Complications: No apparent anesthesia complications

## 2014-10-10 NOTE — Anesthesia Preprocedure Evaluation (Signed)
Anesthesia Evaluation  Patient identified by MRN, date of birth, ID band Patient awake    Reviewed: Allergy & Precautions, H&P , Patient's Chart, lab work & pertinent test results, reviewed documented beta blocker date and time   History of Anesthesia Complications (+) PONVNegative for: history of anesthetic complications  Airway Mallampati: II  TM Distance: >3 FB Neck ROM: full    Dental   Pulmonary  breath sounds clear to auscultation        Cardiovascular Exercise Tolerance: Good Rhythm:regular Rate:Normal     Neuro/Psych PSYCHIATRIC DISORDERS Anxiety    GI/Hepatic   Endo/Other    Renal/GU      Musculoskeletal   Abdominal   Peds  Hematology   Anesthesia Other Findings   Reproductive/Obstetrics                             Anesthesia Physical Anesthesia Plan  ASA: II  Anesthesia Plan: General ETT   Post-op Pain Management:    Induction:   Airway Management Planned:   Additional Equipment:   Intra-op Plan:   Post-operative Plan:   Informed Consent: I have reviewed the patients History and Physical, chart, labs and discussed the procedure including the risks, benefits and alternatives for the proposed anesthesia with the patient or authorized representative who has indicated his/her understanding and acceptance.   Dental Advisory Given  Plan Discussed with: CRNA and Surgeon  Anesthesia Plan Comments:         Anesthesia Quick Evaluation

## 2014-10-10 NOTE — Anesthesia Postprocedure Evaluation (Signed)
  Anesthesia Post-op Note  Patient: Brianna Cook  Procedure(s) Performed: Procedure(s): ROBOTIC ASSISTED TOTAL HYSTERECTOMY (N/A) BILATERAL SALPINGECTOMY (Bilateral) Patient is awake and responsive. Pain and nausea are reasonably well controlled. Vital signs are stable and clinically acceptable. Oxygen saturation is clinically acceptable. There are no apparent anesthetic complications at this time. Patient is ready for discharge.

## 2014-10-10 NOTE — Progress Notes (Signed)
Patient ID: Brianna Cook, female   DOB: 04-22-1966, 49 y.o.   MRN: [id number] Patient seen and examined. Consent witnessed and signed. No changes noted. Update completed.

## 2014-10-11 ENCOUNTER — Encounter (HOSPITAL_COMMUNITY): Payer: Self-pay | Admitting: Obstetrics and Gynecology

## 2014-10-11 DIAGNOSIS — N920 Excessive and frequent menstruation with regular cycle: Secondary | ICD-10-CM | POA: Diagnosis not present

## 2014-10-11 LAB — CBC
HCT: 32 % — ABNORMAL LOW (ref 36.0–46.0)
HEMOGLOBIN: 10.7 g/dL — AB (ref 12.0–15.0)
MCH: 30.9 pg (ref 26.0–34.0)
MCHC: 33.4 g/dL (ref 30.0–36.0)
MCV: 92.5 fL (ref 78.0–100.0)
Platelets: 158 10*3/uL (ref 150–400)
RBC: 3.46 MIL/uL — AB (ref 3.87–5.11)
RDW: 12.7 % (ref 11.5–15.5)
WBC: 10.3 10*3/uL (ref 4.0–10.5)

## 2014-10-11 LAB — BASIC METABOLIC PANEL
Anion gap: 5 (ref 5–15)
BUN: 7 mg/dL (ref 6–23)
CHLORIDE: 103 meq/L (ref 96–112)
CO2: 29 mmol/L (ref 19–32)
CREATININE: 0.61 mg/dL (ref 0.50–1.10)
Calcium: 8.8 mg/dL (ref 8.4–10.5)
GFR calc Af Amer: >90 mL/min (ref >90)
Glucose, Bld: 129 mg/dL — ABNORMAL HIGH (ref 70–99)
Potassium: 4.1 mmol/L (ref 3.5–5.1)
Sodium: 137 mmol/L (ref 135–145)

## 2014-10-11 MED ORDER — HYDROCODONE-IBUPROFEN 7.5-200 MG PO TABS: 1.0000 | ORAL_TABLET | Freq: Three times a day (TID) | ORAL | Status: DC | PRN

## 2014-10-11 MED ORDER — TRAMADOL HCL 50 MG PO TABS: 50.0000 mg | ORAL_TABLET | Freq: Four times a day (QID) | ORAL | Status: DC | PRN

## 2014-10-11 MED ORDER — OXYCODONE-ACETAMINOPHEN 5-325 MG PO TABS: 1.0000 | ORAL_TABLET | ORAL | Status: DC | PRN

## 2014-10-11 NOTE — Op Note (Signed)
Brianna Cook, Brianna Cook             ACCOUNT NO.:  [id number]  MEDICAL RECORD NO.:  [id number]  LOCATION:  9179                          FACILITY:  [facility]  PHYSICIAN:  Lovenia Kim, M.D.DATE OF BIRTH:  [birth year]  DATE OF PROCEDURE: DATE OF DISCHARGE:                              OPERATIVE REPORT   PREOPERATIVE DIAGNOSES:  Failed endometrial ablation, dysmenorrhea, and menorrhagia.  POSTOPERATIVE DIAGNOSES:  Failed endometrial ablation, dysmenorrhea, and menorrhagia, left sigmoid adhesions.  PROCEDURE:  Research officer, trade union assisted total laparoscopic hysterectomy, bilateral salpingectomy, lysis of pelvic adhesions, lysis of left sigmoid and left ovarian adhesions.  SURGEON:  Lovenia Kim, M.D.  ASSISTANT:  Renato Battles, CNM.  ANESTHESIA:  General.  ESTIMATED BLOOD LOSS:  50 mL.  COMPLICATIONS:  None.  DRAINS:  Foley.  COUNTS:  Correct.  DISPOSITION:  The patient to recovery in good condition.  BRIEF OPERATIVE NOTE:  After being apprised of risks of anesthesia, infection, bleeding, injury to surrounding organs, possible need for repair, delayed versus immediate complications to include bowel and bladder injury, possible need for repair, the patient was brought to the operating room where she was administered general anesthetic without complications.  Prepped and draped in usual sterile fashion.  Foley catheter placed.  RUMI retractors placed gently per vagina due to history of previous endometrial ablation.  Cervix was stenotic and easily dilated up to a #25 Pratt dilator.  After placement of the RUMI, the infraumbilical incision was made with a scalpel.  Veress needle placed opening pressure -1.  AirSeal was used to insufflate 3 L CO2 insufflated without difficulty.  Atraumatic trocar entry was noted.  At this time, there are significant adhesions of the left sigmoid, into the left adnexa, and into the left ovarian fossa involving the left ovary. This was noted after  establishing deep Trendelenburg position.  The robot was docked in a standard fashion.  Two robotic arms were placed on left and right under direct visualization and a 5-mm port placed on the left.  The AirSeal was then further established.  The robot was docked after achieving deep Trendelenburg positioning and EndoShears and PK forceps were placed.  Sharp lysis of adhesions were performed on the left adnexa exposing and otherwise normal left ovarian fossa on the left ureter peristalsing normally.  The left tube which has been previously surgically divided, it was removed the remaining segment in total after dissection along the mesosalpinx sharply.  Good hemostasis was noted and the tubal segment was removed.  The same thing was done with the right tubal segment.  This has also been previously divided and both were sent for permanent pathology.  Then, the retroperitoneal space was entered on the left.  The tubo-ovarian ligament was cauterized and a 2-point bipolar method and divided.  The ureter which is appearing normal and lateral on the left is on a higher position, therefore ureterolysis was performed along the left as the ureters dissected sharply off the medial leaf of the peritoneum pushing its cephalad in the process.  Good hemostasis was noted.  This was performed.  The round ligaments then cauterized and divided.  The bladder flap was developed sharply.  The left uterine vessels skeletonized, cauterized in a  2-point method using bipolar cautery but not divided on the right side.  The retroperitoneal space was entered.  The ureter was identified peristalsing along the medial leaf of the peritoneum.  The left ovarian ligament was cauterized and divided.  The right tubo-ovarian ligament was cauterized and divided.  The right ureter having previously been noted.  The right round ligament was cauterized and divided.  The bladder flap was further developed.  The right uterine vessels  skeletonized, cauterized, and divided.  The left uterine vessel then was further cauterized and divided.  Good hemostasis noted.  The bladder flap was sharply developed off the lower uterine segment and the RUMI cup was seen bulging at the cervicovaginal junction.  At this time, the balloon occluder was insufflated and the specimen was detached circumferentially and retracted into the vagina and removed.  The urine was clear.  The vagina then closed using 0 V-Loc suture in a continuous running fashion.  A second imbricating layer placed.  McCall culdoplasty suture placed and reperitonization stitch was performed over the midline.  At this time, irrigation was accomplished.  Both ureters appear to be peristalsing normally and bilaterally.  The urine output was copious and normal.  The robot was then undocked in the standard fashion.  CO2 was released per AirSeal.  All instruments were removed under direct visualization.  The midline incision was closed using 0 Vicryl and 4-0 Vicryl.  The other incisions were closed using 4-0 Vicryl.  Pressure dressings were placed on the left upper incision due to some oozing from the deep portion of this incision.  Good hemostasis noted at the end of the procedure. Ropivacaine has been placed into the pelvis before closing all incisions.  The patient tolerated the procedure well.  Vaginal exam reveals a well-approximated vaginal cuff with no evidence of defect. The patient tolerated the procedure well and was transferred to recovery in good condition.     Lovenia Kim, M.D.     RJT/MEDQ  D:  10/10/2014  T:  10/11/2014  Job:  [id number]

## 2014-10-11 NOTE — Progress Notes (Signed)
1 Day Post-Op Procedure(s) (LRB): ROBOTIC ASSISTED TOTAL HYSTERECTOMY (N/A) BILATERAL SALPINGECTOMY (Bilateral)  Subjective: Patient reports nausea, incisional pain, tolerating PO, + flatus and no problems voiding.    Objective:Marland Kitchen BP 101/43 mmHg  Pulse 60  Temp(Src) 98.2 F (36.8 C) (Oral)  Resp 20  Ht 5' (1.524 m)  Wt 55.339 kg (122 lb)  BMI 23.83 kg/m2  SpO2 100%  I have reviewed patient's vital signs, intake and output, medications and labs. CBC    Component Value Date/Time   WBC 10.3 10/11/2014 0538   RBC 3.46* 10/11/2014 0538   HGB 10.7* 10/11/2014 0538   HCT 32.0* 10/11/2014 0538   PLT 158 10/11/2014 0538   MCV 92.5 10/11/2014 0538   MCH 30.9 10/11/2014 0538   MCHC 33.4 10/11/2014 0538   RDW 12.7 10/11/2014 0538   LYMPHSABS 1.8 09/09/2008 1501   MONOABS 0.4 09/09/2008 1501   EOSABS 0.1 09/09/2008 1501   BASOSABS 0.0 09/09/2008 1501      General: alert, cooperative and appears stated age Resp: clear to auscultation bilaterally and normal percussion bilaterally Cardio: regular rate and rhythm, S1, S2 normal, no murmur, click, rub or gallop and normal apical impulse GI: soft, non-tender; bowel sounds normal; no masses,  no organomegaly and incision: clean, dry and intact Extremities: extremities normal, atraumatic, no cyanosis or edema and Homans sign is negative, no sign of DVT Vaginal Bleeding: minimal  Assessment: s/p Procedure(s): ROBOTIC ASSISTED TOTAL HYSTERECTOMY (N/A) BILATERAL SALPINGECTOMY (Bilateral): stable, progressing well and tolerating diet  Plan: Advance diet Encourage ambulation Advance to PO medication Discontinue IV fluids Discharge home  LOS: 1 day    Shelbe Haglund J 10/11/2014, 7:59 AM

## 2014-10-11 NOTE — Progress Notes (Signed)
Pt out in wheelchair teaching complete  

## 2014-10-11 NOTE — Addendum Note (Signed)
Addendum  created 10/11/14 7096 by Georgeanne Nim, CRNA   Modules edited: Notes Section   Notes Section:  File: [id number]

## 2014-10-11 NOTE — Anesthesia Postprocedure Evaluation (Signed)
  Anesthesia Post-op Note  Patient: Brianna Cook  Procedure(s) Performed: Procedure(s): ROBOTIC ASSISTED TOTAL HYSTERECTOMY (N/A) BILATERAL SALPINGECTOMY (Bilateral)  Patient Location: [facility]  Anesthesia Type:General  Level of Consciousness: awake, alert , oriented and patient cooperative  Airway and Oxygen Therapy: Patient Spontanous Breathing  Post-op Pain: mild  Post-op Assessment: Patient's Cardiovascular Status Stable, Respiratory Function Stable, No signs of Nausea or vomiting and Pain level controlled  Post-op Vital Signs: stable  Last Vitals:  Filed Vitals:   10/11/14 0554  BP:   Pulse:   Temp:   Resp: 20    Complications: No apparent anesthesia complications

## 2016-10-05 DIAGNOSIS — Z01419 Encounter for gynecological examination (general) (routine) without abnormal findings: Secondary | ICD-10-CM | POA: Diagnosis not present

## 2016-10-05 DIAGNOSIS — N951 Menopausal and female climacteric states: Secondary | ICD-10-CM | POA: Diagnosis not present

## 2016-10-05 DIAGNOSIS — Z6824 Body mass index (BMI) 24.0-24.9, adult: Secondary | ICD-10-CM | POA: Diagnosis not present

## 2016-10-31 DIAGNOSIS — J0140 Acute pansinusitis, unspecified: Secondary | ICD-10-CM | POA: Diagnosis not present

## 2016-10-31 DIAGNOSIS — R0602 Shortness of breath: Secondary | ICD-10-CM | POA: Diagnosis not present

## 2016-10-31 DIAGNOSIS — R05 Cough: Secondary | ICD-10-CM | POA: Diagnosis not present

## 2016-11-02 DIAGNOSIS — J329 Chronic sinusitis, unspecified: Secondary | ICD-10-CM | POA: Diagnosis not present

## 2016-11-11 DIAGNOSIS — R51 Headache: Secondary | ICD-10-CM | POA: Diagnosis not present

## 2016-11-11 DIAGNOSIS — J309 Allergic rhinitis, unspecified: Secondary | ICD-10-CM | POA: Diagnosis not present

## 2016-11-23 DIAGNOSIS — J301 Allergic rhinitis due to pollen: Secondary | ICD-10-CM | POA: Diagnosis not present

## 2016-11-25 DIAGNOSIS — J342 Deviated nasal septum: Secondary | ICD-10-CM | POA: Diagnosis not present

## 2016-11-25 DIAGNOSIS — J301 Allergic rhinitis due to pollen: Secondary | ICD-10-CM | POA: Diagnosis not present

## 2016-11-26 DIAGNOSIS — J301 Allergic rhinitis due to pollen: Secondary | ICD-10-CM | POA: Diagnosis not present

## 2016-11-29 DIAGNOSIS — J301 Allergic rhinitis due to pollen: Secondary | ICD-10-CM | POA: Diagnosis not present

## 2016-12-02 DIAGNOSIS — J301 Allergic rhinitis due to pollen: Secondary | ICD-10-CM | POA: Diagnosis not present

## 2016-12-06 DIAGNOSIS — J301 Allergic rhinitis due to pollen: Secondary | ICD-10-CM | POA: Diagnosis not present

## 2016-12-07 DIAGNOSIS — J301 Allergic rhinitis due to pollen: Secondary | ICD-10-CM | POA: Diagnosis not present

## 2016-12-09 DIAGNOSIS — J301 Allergic rhinitis due to pollen: Secondary | ICD-10-CM | POA: Diagnosis not present

## 2016-12-13 DIAGNOSIS — J301 Allergic rhinitis due to pollen: Secondary | ICD-10-CM | POA: Diagnosis not present

## 2016-12-16 DIAGNOSIS — J301 Allergic rhinitis due to pollen: Secondary | ICD-10-CM | POA: Diagnosis not present

## 2016-12-20 DIAGNOSIS — J301 Allergic rhinitis due to pollen: Secondary | ICD-10-CM | POA: Diagnosis not present

## 2016-12-23 DIAGNOSIS — J301 Allergic rhinitis due to pollen: Secondary | ICD-10-CM | POA: Diagnosis not present

## 2016-12-27 DIAGNOSIS — J301 Allergic rhinitis due to pollen: Secondary | ICD-10-CM | POA: Diagnosis not present

## 2016-12-30 DIAGNOSIS — J301 Allergic rhinitis due to pollen: Secondary | ICD-10-CM | POA: Diagnosis not present

## 2017-01-06 DIAGNOSIS — J301 Allergic rhinitis due to pollen: Secondary | ICD-10-CM | POA: Diagnosis not present

## 2017-01-13 DIAGNOSIS — J301 Allergic rhinitis due to pollen: Secondary | ICD-10-CM | POA: Diagnosis not present

## 2017-01-18 DIAGNOSIS — Z79899 Other long term (current) drug therapy: Secondary | ICD-10-CM | POA: Diagnosis not present

## 2017-01-18 DIAGNOSIS — Z86010 Personal history of colonic polyps: Secondary | ICD-10-CM | POA: Diagnosis not present

## 2017-01-18 DIAGNOSIS — G479 Sleep disorder, unspecified: Secondary | ICD-10-CM | POA: Diagnosis not present

## 2017-01-18 DIAGNOSIS — Z0001 Encounter for general adult medical examination with abnormal findings: Secondary | ICD-10-CM | POA: Diagnosis not present

## 2017-01-18 DIAGNOSIS — J309 Allergic rhinitis, unspecified: Secondary | ICD-10-CM | POA: Diagnosis not present

## 2017-01-20 DIAGNOSIS — J301 Allergic rhinitis due to pollen: Secondary | ICD-10-CM | POA: Diagnosis not present

## 2017-01-27 DIAGNOSIS — Z86010 Personal history of colonic polyps: Secondary | ICD-10-CM | POA: Diagnosis not present

## 2017-01-28 DIAGNOSIS — J301 Allergic rhinitis due to pollen: Secondary | ICD-10-CM | POA: Diagnosis not present

## 2017-03-02 DIAGNOSIS — J301 Allergic rhinitis due to pollen: Secondary | ICD-10-CM | POA: Diagnosis not present

## 2017-03-03 DIAGNOSIS — L237 Allergic contact dermatitis due to plants, except food: Secondary | ICD-10-CM | POA: Diagnosis not present

## 2017-04-07 DIAGNOSIS — J301 Allergic rhinitis due to pollen: Secondary | ICD-10-CM | POA: Diagnosis not present

## 2017-04-29 DIAGNOSIS — Z86010 Personal history of colonic polyps: Secondary | ICD-10-CM | POA: Diagnosis not present

## 2017-06-22 ENCOUNTER — Other Ambulatory Visit: Payer: Self-pay | Admitting: Internal Medicine

## 2017-06-22 ENCOUNTER — Ambulatory Visit
Admission: RE | Admit: 2017-06-22 | Discharge: 2017-06-22 | Disposition: A | Discharge status: Home / Self Care | Payer: 59 | Source: Ambulatory Visit | Attending: Internal Medicine | Admitting: Internal Medicine

## 2017-06-22 DIAGNOSIS — M79641 Pain in right hand: Secondary | ICD-10-CM | POA: Diagnosis not present

## 2017-06-22 DIAGNOSIS — R5383 Other fatigue: Secondary | ICD-10-CM | POA: Diagnosis not present

## 2017-06-22 DIAGNOSIS — M79642 Pain in left hand: Secondary | ICD-10-CM

## 2017-06-22 DIAGNOSIS — M791 Myalgia: Secondary | ICD-10-CM | POA: Diagnosis not present

## 2017-06-22 DIAGNOSIS — M25522 Pain in left elbow: Secondary | ICD-10-CM | POA: Diagnosis not present

## 2017-06-22 DIAGNOSIS — M7989 Other specified soft tissue disorders: Secondary | ICD-10-CM | POA: Diagnosis not present

## 2017-07-06 DIAGNOSIS — N951 Menopausal and female climacteric states: Secondary | ICD-10-CM | POA: Diagnosis not present

## 2017-07-11 DIAGNOSIS — L308 Other specified dermatitis: Secondary | ICD-10-CM | POA: Diagnosis not present

## 2017-07-11 DIAGNOSIS — J342 Deviated nasal septum: Secondary | ICD-10-CM | POA: Diagnosis not present

## 2017-07-11 DIAGNOSIS — J309 Allergic rhinitis, unspecified: Secondary | ICD-10-CM | POA: Diagnosis not present

## 2017-07-11 DIAGNOSIS — L400 Psoriasis vulgaris: Secondary | ICD-10-CM | POA: Diagnosis not present

## 2017-07-11 DIAGNOSIS — L301 Dyshidrosis [pompholyx]: Secondary | ICD-10-CM | POA: Diagnosis not present

## 2017-07-15 DIAGNOSIS — J301 Allergic rhinitis due to pollen: Secondary | ICD-10-CM | POA: Diagnosis not present

## 2017-08-04 DIAGNOSIS — J301 Allergic rhinitis due to pollen: Secondary | ICD-10-CM | POA: Diagnosis not present

## 2017-09-05 DIAGNOSIS — R300 Dysuria: Secondary | ICD-10-CM | POA: Diagnosis not present

## 2017-09-05 DIAGNOSIS — N3001 Acute cystitis with hematuria: Secondary | ICD-10-CM | POA: Diagnosis not present

## 2017-09-21 ENCOUNTER — Encounter: Payer: Self-pay | Admitting: *Deleted

## 2017-09-21 ENCOUNTER — Other Ambulatory Visit: Payer: Self-pay

## 2017-09-28 NOTE — Discharge Instructions (Signed)
[facility] °[facility] °ENDOSCOPIC SINUS SURGERY °[facility] ° °What is Functional Endoscopic Sinus Surgery? ° The Surgery involves making the natural openings of the sinuses larger by removing the bony partitions that separate the sinuses from the nasal cavity.  The natural sinus lining is preserved as much as possible to allow the sinuses to resume normal function after the surgery.  In some patients nasal polyps (excessively swollen lining of the sinuses) may be removed to relieve obstruction of the sinus openings.  The surgery is performed through the nose using lighted scopes, which eliminates the need for incisions on the face.  A septoplasty is a different procedure which is sometimes performed with sinus surgery.  It involves straightening the boy partition that separates the two sides of your nose.  A crooked or deviated septum may need repair if is obstructing the sinuses or nasal airflow.  Turbinate reduction is also often performed during sinus surgery.  The turbinates are bony proturberances from the side walls of the nose which swell and can obstruct the nose in patients with sinus and allergy problems.  Their size can be surgically reduced to help relieve nasal obstruction. ° °What Can Sinus Surgery Do For Me? ° Sinus surgery can reduce the frequency of sinus infections requiring antibiotic treatment.  This can provide improvement in nasal congestion, post-nasal drainage, facial pressure and nasal obstruction.  Surgery will NOT prevent you from ever having an infection again, so it usually only for patients who get infections 4 or more times yearly requiring antibiotics, or for infections that do not clear with antibiotics.  It will not cure nasal allergies, so patients with allergies may still require medication to treat their allergies after surgery. Surgery may improve headaches related to sinusitis, however, some people will continue to  require medication to control sinus headaches related to allergies.  Surgery will do nothing for other forms of headache (migraine, tension or cluster). ° °What Are the Risks of Endoscopic Sinus Surgery? ° Current techniques allow surgery to be performed safely with little risk, however, there are rare complications that patients should be aware of.  Because the sinuses are located around the eyes, there is risk of eye injury, including blindness, though again, this would be quite rare. This is usually a result of bleeding behind the eye during surgery, which puts the vision oat risk, though there are treatments to protect the vision and prevent permanent disrupted by surgery causing a leak of the spinal fluid that surrounds the brain.  More serious complications would include bleeding inside the brain cavity or damage to the brain.  Again, all of these complications are uncommon, and spinal fluid leaks can be safely managed surgically if they occur.  The most common complication of sinus surgery is bleeding from the nose, which may require packing or cauterization of the nose.  Continued sinus have polyps may experience recurrence of the polyps requiring revision surgery.  Alterations of sense of smell or injury to the tear ducts are also rare complications.  ° °What is the Surgery Like, and what is the Recovery? ° The Surgery usually takes a couple of hours to perform, and is usually performed under a general anesthetic (completely asleep).  Patients are usually discharged home after a couple of hours.  Sometimes during surgery it is necessary to pack the nose to control bleeding, and the packing is left in place for 24 - 48 hours, and removed by your surgeon.    If a septoplasty was performed during the procedure, there is often a splint placed which must be removed after 5-7 days.   °Discomfort: Pain is usually mild to moderate, and can be controlled by prescription pain medication or acetaminophen (Tylenol).   Aspirin, Ibuprofen (Advil, Motrin), or Naprosyn (Aleve) should be avoided, as they can cause increased bleeding.  Most patients feel sinus pressure like they have a bad head cold for several days.  Sleeping with your head elevated can help reduce swelling and facial pressure, as can ice packs over the face.  A humidifier may be helpful to keep the mucous and blood from drying in the nose.  ° °Diet: There are no specific diet restrictions, however, you should generally start with clear liquids and a light diet of bland foods because the anesthetic can cause some nausea.  Advance your diet depending on how your stomach feels.  Taking your pain medication with food will often help reduce stomach upset which pain medications can cause. ° °Nasal Saline Irrigation: It is important to remove blood clots and dried mucous from the nose as it is healing.  This is done by having you irrigate the nose at least 3 - 4 times daily with a salt water solution.  We recommend using NeilMed Sinus Rinse (available at the drug store).  Fill the squeeze bottle with the solution, bend over a sink, and insert the tip of the squeeze bottle into the nose ½ of an inch.  Point the tip of the squeeze bottle towards the inside corner of the eye on the same side your irrigating.  Squeeze the bottle and gently irrigate the nose.  If you bend forward as you do this, most of the fluid will flow back out of the nose, instead of down your throat.   The solution should be warm, near body temperature, when you irrigate.   Each time you irrigate, you should use a full squeeze bottle.  ° °Note that if you are instructed to use Nasal Steroid Sprays at any time after your surgery, irrigate with saline BEFORE using the steroid spray, so you do not wash it all out of the nose. °Another product, Nasal Saline Gel (such as AYR Nasal Saline Gel) can be applied in each nostril 3 - 4 times daily to moisture the nose and reduce scabbing or crusting. ° °Bleeding:   Bloody drainage from the nose can be expected for several days, and patients are instructed to irrigate their nose frequently with salt water to help remove mucous and blood clots.  The drainage may be dark red or brown, though some fresh blood may be seen intermittently, especially after irrigation.  Do not blow you nose, as bleeding may occur. If you must sneeze, keep your mouth open to allow air to escape through your mouth. ° °If heavy bleeding occurs: Irrigate the nose with saline to rinse out clots, then spray the nose 3 - 4 times with Afrin Nasal Decongestant Spray.  The spray will constrict the blood vessels to slow bleeding.  Pinch the lower half of your nose shut to apply pressure, and lay down with your head elevated.  Ice packs over the nose may help as well. If bleeding persists despite these measures, you should notify your doctor.  Do not use the Afrin routinely to control nasal congestion after surgery, as it can result in worsening congestion and may affect healing.  ° ° ° °Activity: Return to work varies among patients. Most patients will be   out of work at least 5 - 7 days to recover.  Patient may return to work after they are off of narcotic pain medication, and feeling well enough to perform the functions of their job.  Patients must avoid heavy lifting (over 10 pounds) or strenuous physical for 2 weeks after surgery, so your employer may need to assign you to light duty, or keep you out of work longer if light duty is not possible.  NOTE: you should not drive, operate dangerous machinery, do any mentally demanding tasks or make any important legal or financial decisions while on narcotic pain medication and recovering from the general anesthetic.  °  °Call Your Doctor Immediately if You Have Any of the Following: °1. Bleeding that you cannot control with the above measures °2. Loss of vision, double vision, bulging of the eye or black eyes. °3. Fever over 101 degrees °4. Neck stiffness with  severe headache, fever, nausea and change in mental state. °You are always encourage to call anytime with concerns, however, please call with requests for pain medication refills during office hours. ° °Office Endoscopy: During follow-up visits your doctor will remove any packing or splints that may have been placed and evaluate and clean your sinuses endoscopically.  Topical anesthetic will be used to make this as comfortable as possible, though you may want to take your pain medication prior to the visit.  How often this will need to be done varies from patient to patient.  After complete recovery from the surgery, you may need follow-up endoscopy from time to time, particularly if there is concern of recurrent infection or nasal polyps. ° ° °General Anesthesia, Adult, Care After °These instructions provide you with information about caring for yourself after your procedure. Your health care provider may also give you more specific instructions. Your treatment has been planned according to current medical practices, but problems sometimes occur. Call your health care provider if you have any problems or questions after your procedure. °What can I expect after the procedure? °After the procedure, it is common to have: °· Vomiting. °· A sore throat. °· Mental slowness. ° °It is common to feel: °· Nauseous. °· Cold or shivery. °· Sleepy. °· Tired. °· Sore or achy, even in parts of your body where you did not have surgery. ° °Follow these instructions at home: °For at least 24 hours after the procedure: °· Do not: °? Participate in activities where you could fall or become injured. °? Drive. °? Use heavy machinery. °? Drink alcohol. °? Take sleeping pills or medicines that cause drowsiness. °? Make important decisions or sign legal documents. °? Take care of children on your own. °· Rest. °Eating and drinking °· If you vomit, drink water, juice, or soup when you can drink without vomiting. °· Drink enough fluid to  keep your urine clear or pale yellow. °· Make sure you have little or no nausea before eating solid foods. °· Follow the diet recommended by your health care provider. °General instructions °· Have a responsible adult stay with you until you are awake and alert. °· Return to your normal activities as told by your health care provider. Ask your health care provider what activities are safe for you. °· Take over-the-counter and prescription medicines only as told by your health care provider. °· If you smoke, do not smoke without supervision. °· Keep all follow-up visits as told by your health care provider. This is important. °Contact a health care provider if: °· You   continue to have nausea or vomiting at home, and medicines are not helpful. °· You cannot drink fluids or start eating again. °· You cannot urinate after 8-12 hours. °· You develop a skin rash. °· You have fever. °· You have increasing redness at the site of your procedure. °Get help right away if: °· You have difficulty breathing. °· You have chest pain. °· You have unexpected bleeding. °· You feel that you are having a life-threatening or urgent problem. °This information is not intended to replace advice given to you by your health care provider. Make sure you discuss any questions you have with your health care provider. °Document Released: 12/27/2000 Document Revised: 02/23/2016 Document Reviewed: 09/04/2015 °Elsevier Interactive Patient Education © 2018 [employer] ° °

## 2017-09-30 ENCOUNTER — Ambulatory Visit: Payer: 59 | Admitting: Anesthesiology

## 2017-09-30 ENCOUNTER — Encounter
Admission: RE | Disposition: A | Discharge status: Home / Self Care | Payer: Self-pay | Source: Ambulatory Visit | Attending: Unknown Physician Specialty

## 2017-09-30 ENCOUNTER — Ambulatory Visit
Admission: RE | Admit: 2017-09-30 | Discharge: 2017-09-30 | Disposition: A | Discharge status: Home / Self Care | Payer: 59 | Source: Ambulatory Visit | Attending: Unknown Physician Specialty | Admitting: Unknown Physician Specialty

## 2017-09-30 DIAGNOSIS — J343 Hypertrophy of nasal turbinates: Secondary | ICD-10-CM | POA: Diagnosis not present

## 2017-09-30 DIAGNOSIS — J309 Allergic rhinitis, unspecified: Secondary | ICD-10-CM | POA: Diagnosis not present

## 2017-09-30 DIAGNOSIS — J342 Deviated nasal septum: Secondary | ICD-10-CM | POA: Insufficient documentation

## 2017-09-30 DIAGNOSIS — J3489 Other specified disorders of nose and nasal sinuses: Secondary | ICD-10-CM | POA: Insufficient documentation

## 2017-09-30 DIAGNOSIS — Z79899 Other long term (current) drug therapy: Secondary | ICD-10-CM | POA: Diagnosis not present

## 2017-09-30 DIAGNOSIS — Z882 Allergy status to sulfonamides status: Secondary | ICD-10-CM | POA: Diagnosis not present

## 2017-09-30 DIAGNOSIS — M1990 Unspecified osteoarthritis, unspecified site: Secondary | ICD-10-CM | POA: Diagnosis not present

## 2017-09-30 DIAGNOSIS — Z885 Allergy status to narcotic agent status: Secondary | ICD-10-CM | POA: Diagnosis not present

## 2017-09-30 HISTORY — DX: Headache, unspecified: R51.9

## 2017-09-30 HISTORY — DX: Headache: R51

## 2017-09-30 HISTORY — PX: NASAL SEPTOPLASTY W/ TURBINOPLASTY: SHX2070

## 2017-09-30 SURGERY — SEPTOPLASTY, NOSE, WITH NASAL TURBINATE REDUCTION
Anesthesia: General | Site: Nose | Laterality: Bilateral | Wound class: Clean Contaminated

## 2017-09-30 MED ORDER — ACETAMINOPHEN 10 MG/ML IV SOLN
1000.0000 mg | Freq: Once | INTRAVENOUS | Status: AC
  Administered 2017-09-30: 1000 mg via INTRAVENOUS

## 2017-09-30 MED ORDER — DEXAMETHASONE SODIUM PHOSPHATE 4 MG/ML IJ SOLN
INTRAMUSCULAR | Status: DC | PRN
  Administered 2017-09-30: 2 mg via INTRAVENOUS
  Administered 2017-09-30: 10 mg via INTRAVENOUS

## 2017-09-30 MED ORDER — OXYCODONE HCL 5 MG PO TABS: 5.0000 mg | ORAL_TABLET | Freq: Once | ORAL | Status: DC | PRN

## 2017-09-30 MED ORDER — TRAMADOL HCL 50 MG PO TABS: 50.0000 mg | ORAL_TABLET | Freq: Four times a day (QID) | ORAL | 0 refills | Status: AC | PRN

## 2017-09-30 MED ORDER — ONDANSETRON HCL 4 MG/2ML IJ SOLN: 4.0000 mg | Freq: Once | INTRAMUSCULAR | Status: DC | PRN

## 2017-09-30 MED ORDER — SCOPOLAMINE 1 MG/3DAYS TD PT72
1.0000 | MEDICATED_PATCH | Freq: Once | TRANSDERMAL | Status: DC
  Administered 2017-09-30: 1.5 mg via TRANSDERMAL

## 2017-09-30 MED ORDER — TRAMADOL HCL 50 MG PO TABS
50.0000 mg | ORAL_TABLET | Freq: Once | ORAL | Status: AC
  Administered 2017-09-30: 50 mg via ORAL

## 2017-09-30 MED ORDER — LACTATED RINGERS IV SOLN
INTRAVENOUS | Status: DC
  Administered 2017-09-30: 10:00:00 via INTRAVENOUS

## 2017-09-30 MED ORDER — PROPOFOL 10 MG/ML IV BOLUS
INTRAVENOUS | Status: DC | PRN
  Administered 2017-09-30: 140 mg via INTRAVENOUS

## 2017-09-30 MED ORDER — LIDOCAINE HCL (CARDIAC) 20 MG/ML IV SOLN
INTRAVENOUS | Status: DC | PRN
  Administered 2017-09-30: 40 mg via INTRAVENOUS

## 2017-09-30 MED ORDER — LIDOCAINE HCL 4 % MT SOLN
OROMUCOSAL | Status: DC | PRN
  Administered 2017-09-30: 4 mL via TOPICAL

## 2017-09-30 MED ORDER — SUCCINYLCHOLINE CHLORIDE 20 MG/ML IJ SOLN
INTRAMUSCULAR | Status: DC | PRN
  Administered 2017-09-30: 80 mg via INTRAVENOUS

## 2017-09-30 MED ORDER — PHENYLEPHRINE HCL 10 MG/ML IJ SOLN
INTRAMUSCULAR | Status: DC | PRN
  Administered 2017-09-30 (×2): 50 ug via INTRAVENOUS

## 2017-09-30 MED ORDER — BACITRACIN ZINC 500 UNIT/GM EX OINT
TOPICAL_OINTMENT | CUTANEOUS | Status: DC | PRN
  Administered 2017-09-30: 1 via TOPICAL

## 2017-09-30 MED ORDER — GLYCOPYRROLATE 0.2 MG/ML IJ SOLN
INTRAMUSCULAR | Status: DC | PRN
  Administered 2017-09-30: 0.1 mg via INTRAVENOUS

## 2017-09-30 MED ORDER — MIDAZOLAM HCL 5 MG/5ML IJ SOLN
INTRAMUSCULAR | Status: DC | PRN
  Administered 2017-09-30: 2 mg via INTRAVENOUS

## 2017-09-30 MED ORDER — PHENYLEPHRINE HCL 0.5 % NA SOLN
NASAL | Status: DC | PRN
  Administered 2017-09-30: 30 mL via TOPICAL

## 2017-09-30 MED ORDER — FENTANYL CITRATE (PF) 100 MCG/2ML IJ SOLN: 25.0000 ug | INTRAMUSCULAR | Status: DC | PRN

## 2017-09-30 MED ORDER — ONDANSETRON HCL 4 MG/2ML IJ SOLN
INTRAMUSCULAR | Status: DC | PRN
  Administered 2017-09-30: 4 mg via INTRAVENOUS

## 2017-09-30 MED ORDER — SULFAMETHOXAZOLE-TRIMETHOPRIM 800-160 MG PO TABS: 1.0000 | ORAL_TABLET | Freq: Two times a day (BID) | ORAL | 0 refills | Status: AC

## 2017-09-30 MED ORDER — FENTANYL CITRATE (PF) 100 MCG/2ML IJ SOLN
INTRAMUSCULAR | Status: DC | PRN
  Administered 2017-09-30: 50 ug via INTRAVENOUS

## 2017-09-30 MED ORDER — OXYMETAZOLINE HCL 0.05 % NA SOLN
6.0000 | Freq: Once | NASAL | Status: AC
  Administered 2017-09-30: 6 via NASAL

## 2017-09-30 MED ORDER — OXYCODONE HCL 5 MG/5ML PO SOLN: 5.0000 mg | Freq: Once | ORAL | Status: DC | PRN

## 2017-09-30 MED ORDER — LIDOCAINE-EPINEPHRINE (PF) 1 %-1:200000 IJ SOLN
INTRAMUSCULAR | Status: DC | PRN
  Administered 2017-09-30: 7 mL

## 2017-09-30 MED ORDER — EPHEDRINE SULFATE 50 MG/ML IJ SOLN
INTRAMUSCULAR | Status: DC | PRN
  Administered 2017-09-30 (×2): 5 mg via INTRAVENOUS

## 2017-09-30 SURGICAL SUPPLY — 30 items
COAG SUCT 10F 3.5MM HAND CTRL (MISCELLANEOUS) ×3 IMPLANT
DRAPE HEAD BAR (DRAPES) ×3 IMPLANT
DRESSING NASL FOAM PST OP SINU (MISCELLANEOUS) IMPLANT
DRSG NASAL FOAM POST OP SINU (MISCELLANEOUS)
GLOVE BIO SURGEON STRL SZ7.5 (GLOVE) ×6 IMPLANT
HANDLE YANKAUER SUCT BULB TIP (MISCELLANEOUS) ×3 IMPLANT
KIT ROOM TURNOVER OR (KITS) ×3 IMPLANT
NDL HYPO 25GX1X1/2 BEV (NEEDLE) ×1 IMPLANT
NEEDLE HYPO 25GX1X1/2 BEV (NEEDLE) ×3 IMPLANT
PACK DRAPE NASAL/ENT (PACKS) ×3 IMPLANT
PAD GROUND ADULT SPLIT (MISCELLANEOUS) ×3 IMPLANT
SPLINT NASAL SEPTAL BLV .25 LG (MISCELLANEOUS) IMPLANT
SPLINT NASAL SEPTAL BLV .50 ST (MISCELLANEOUS) IMPLANT
SPONGE NEURO XRAY DETECT 1X3 (DISPOSABLE) ×3 IMPLANT
STRAP BODY AND KNEE 60X3 (MISCELLANEOUS) ×3 IMPLANT
SUT CHROMIC 3-0 (SUTURE) ×6
SUT CHROMIC 3-0 54XMFL REEL CR (SUTURE) ×1
SUT CHROMIC 3-0 KS 27XMFL CR (SUTURE) ×1
SUT CHROMIC 5-0 (SUTURE)
SUT CHROMIC 5-0 P2 18XMFL CR (SUTURE)
SUT ETHILON 3-0 FS-10 30 BLK (SUTURE) ×3
SUT ETHILON 3-0 KS 30 BLK (SUTURE) ×3 IMPLANT
SUT PLAIN GUT 4-0 (SUTURE) ×2 IMPLANT
SUTURE CHRMC 3-0 54XMFL REL CR (SUTURE) IMPLANT
SUTURE CHRMC 3-0 KS 27XMFL CR (SUTURE) ×1 IMPLANT
SUTURE CHRMC 5-0 P2 18XMF CR (SUTURE) IMPLANT
SUTURE EHLN 3-0 FS-10 30 BLK (SUTURE) IMPLANT
SYRINGE 10CC LL (SYRINGE) ×3 IMPLANT
TOWEL OR 17X26 4PK STRL BLUE (TOWEL DISPOSABLE) ×3 IMPLANT
WATER STERILE IRR 250ML POUR (IV SOLUTION) ×3 IMPLANT

## 2017-09-30 NOTE — Transfer of Care (Signed)
Immediate Anesthesia Transfer of Care Note  Patient: Brianna Cook  Procedure(s) Performed: NASAL SEPTOPLASTY WITH SUBMUCOUS RESECTION TURBINATE (Bilateral Nose)  Patient Location: PACU  Anesthesia Type: General  Level of Consciousness: awake, alert  and patient cooperative  Airway and Oxygen Therapy: Patient Spontanous Breathing and Patient connected to supplemental oxygen  Post-op Assessment: Post-op Vital signs reviewed, Patient's Cardiovascular Status Stable, Respiratory Function Stable, Patent Airway and No signs of Nausea or vomiting  Post-op Vital Signs: Reviewed and stable  Complications: No apparent anesthesia complications

## 2017-09-30 NOTE — Anesthesia Postprocedure Evaluation (Signed)
Anesthesia Post Note  Patient: Brianna Cook  Procedure(s) Performed: NASAL SEPTOPLASTY WITH SUBMUCOUS RESECTION TURBINATE (Bilateral Nose)  Patient location during evaluation: PACU Anesthesia Type: General Level of consciousness: awake and alert and oriented Pain management: satisfactory to patient Vital Signs Assessment: post-procedure vital signs reviewed and stable Respiratory status: spontaneous breathing, nonlabored ventilation and respiratory function stable Cardiovascular status: blood pressure returned to baseline and stable Postop Assessment: Adequate PO intake and No signs of nausea or vomiting Anesthetic complications: no    Raliegh Ip

## 2017-09-30 NOTE — H&P (Signed)
The patient's history has been reviewed, patient examined, no change in status, stable for surgery.  Questions were answered to the patients satisfaction.  

## 2017-09-30 NOTE — Anesthesia Procedure Notes (Addendum)
Procedure Name: Intubation Date/Time: 09/30/2017 11:58 AM Performed by: Mayme Genta, CRNA Pre-anesthesia Checklist: Patient identified, Emergency Drugs available, Suction available, Patient being monitored and Timeout performed Patient Re-evaluated:Patient Re-evaluated prior to induction Oxygen Delivery Method: Circle system utilized Preoxygenation: Pre-oxygenation with 100% oxygen Induction Type: IV induction Ventilation: Mask ventilation without difficulty Laryngoscope Size: Miller and 2 Grade View: Grade I Tube type: Oral Rae Tube size: 7.0 mm Number of attempts: 1 Placement Confirmation: ETT inserted through vocal cords under direct vision,  positive ETCO2 and breath sounds checked- equal and bilateral Tube secured with: Tape Dental Injury: Teeth and Oropharynx as per pre-operative assessment  Comments: Difficulty passing ETT through cords after DLx1. Passed Boujie without difficulty and passed ETT over without difficulty on second DL.Marland Kitchen Toelrated well by pt.

## 2017-09-30 NOTE — Anesthesia Preprocedure Evaluation (Signed)
Anesthesia Evaluation  Patient identified by MRN, date of birth, ID band Patient awake    Reviewed: Allergy & Precautions, H&P , NPO status , Patient's Chart, lab work & pertinent test results  History of Anesthesia Complications (+) PONV and history of anesthetic complications  Airway Mallampati: II  TM Distance: >3 FB Neck ROM: full    Dental no notable dental hx. (+) Chipped, Dental Advidsory Given   Pulmonary    Pulmonary exam normal breath sounds clear to auscultation       Cardiovascular Normal cardiovascular exam Rhythm:regular Rate:Normal     Neuro/Psych    GI/Hepatic   Endo/Other    Renal/GU      Musculoskeletal   Abdominal   Peds  Hematology   Anesthesia Other Findings   Reproductive/Obstetrics                             Anesthesia Physical Anesthesia Plan  ASA: II  Anesthesia Plan: General   Post-op Pain Management:    Induction:   PONV Risk Score and Plan: 4 or greater and Scopolamine patch - Pre-op, Dexamethasone, Ondansetron, Midazolam and Treatment may vary due to age or medical condition  Airway Management Planned:   Additional Equipment:   Intra-op Plan:   Post-operative Plan:   Informed Consent: I have reviewed the patients History and Physical, chart, labs and discussed the procedure including the risks, benefits and alternatives for the proposed anesthesia with the patient or authorized representative who has indicated his/her understanding and acceptance.     Plan Discussed with: CRNA  Anesthesia Plan Comments:         Anesthesia Quick Evaluation

## 2017-09-30 NOTE — Op Note (Signed)
PREOPERATIVE DIAGNOSIS:  Chronic nasal obstruction.  POSTOPERATIVE DIAGNOSIS:  Chronic nasal obstruction.  SURGEON:  Roena Malady, M.D.  NAME OF PROCEDURE:  1. Nasal septoplasty. 2. Submucous resection of inferior turbinates.  OPERATIVE FINDINGS:  Severe nasal septal deformity, hypertrophy of the inferior turbinates.   DESCRIPTION OF THE PROCEDURE:  Brianna Cook was identified in the holding area and taken to the operating room and placed in the supine position.  After general endotracheal anesthesia was induced, the table was turned 45 degrees and the patient was placed in a semi-Fowler position.  The nose was then topically anesthetized with Lidocaine, cotton pledgets were placed within each nostril. After approximately 5 minutes, this was removed at which time a local anesthetic of 2% Lidocaine 1:200,000 units of Epinephrine was used to inject the inferior turbinates in the nasal septum. A total of 12 ml was used. Examination of the nose showed a severe left nasal septal deformity and tremendous hypertrophied inferior turbinate.  Beginning on the left hand side a hemitransfixion incision was then created on the leading edge of the septum on the left.  A subperichondrial plane was elevated posteriorly on the left and taken back to the perpendicular plate of the ethmoid where subperiosteal plane was elevated posteriorly on the left.  An inferior rim of cartilage was removed anteriorly with care taken to leave an anterior strut to prevent nasal collapse. With this strut removed the perpendicular plate of the ethmoid was separated from the quadrangular cartilage. The large septal spur was removed.  The septum was then replaced in the midline. Reinspection through each nostril showed excellent reduction of the septal deformity. A left posterior inferior fenestration was then created to allow hematoma drainage.  With the septoplasty completed, beginning on the left-hand side, a 15 blade was used to  incise along the inferior edge of the inferior turbinate. A superior laterally based flap was then elevated. The underlying conchal bone of mucosa was excised using Knight scissors. The flap was then laid back over the turbinate stump and cauterized using suction cautery. In a similar fashion the submucous resection was performed on the right.  With the submucous resection completed bilaterally and no active bleeding, the hemitransfixion incision was then closed using two interrupted 3-0 chromic sutures.  Plastic nasal septal splints were placed within each nostril and affixed to the septum using a 3-0 nylon suture. Stammberger was then used beneath each inferior turbinate for hemostasis.    The patient tolerated the procedure well, was returned to anesthesia, extubated in the operating room, and taken to the recovery room in stable condition.    CULTURES:  None.  SPECIMENS:  None.  ESTIMATED BLOOD LOSS:  25 cc.  Beverly Gust T  09/30/2017  12:34 PM

## 2017-10-14 DIAGNOSIS — Z01419 Encounter for gynecological examination (general) (routine) without abnormal findings: Secondary | ICD-10-CM | POA: Diagnosis not present

## 2017-10-14 DIAGNOSIS — Z6825 Body mass index (BMI) 25.0-25.9, adult: Secondary | ICD-10-CM | POA: Diagnosis not present

## 2017-10-14 DIAGNOSIS — Z1231 Encounter for screening mammogram for malignant neoplasm of breast: Secondary | ICD-10-CM | POA: Diagnosis not present

## 2017-11-16 DIAGNOSIS — Z0100 Encounter for examination of eyes and vision without abnormal findings: Secondary | ICD-10-CM | POA: Diagnosis not present

## 2017-11-17 DIAGNOSIS — M62830 Muscle spasm of back: Secondary | ICD-10-CM | POA: Diagnosis not present

## 2017-11-17 DIAGNOSIS — S39012A Strain of muscle, fascia and tendon of lower back, initial encounter: Secondary | ICD-10-CM | POA: Diagnosis not present

## 2017-11-18 DIAGNOSIS — J301 Allergic rhinitis due to pollen: Secondary | ICD-10-CM | POA: Diagnosis not present

## 2017-11-21 DIAGNOSIS — J301 Allergic rhinitis due to pollen: Secondary | ICD-10-CM | POA: Diagnosis not present

## 2017-12-30 DIAGNOSIS — R8271 Bacteriuria: Secondary | ICD-10-CM | POA: Diagnosis not present

## 2017-12-30 DIAGNOSIS — N3020 Other chronic cystitis without hematuria: Secondary | ICD-10-CM | POA: Diagnosis not present

## 2017-12-30 DIAGNOSIS — N200 Calculus of kidney: Secondary | ICD-10-CM | POA: Diagnosis not present

## 2017-12-30 DIAGNOSIS — R338 Other retention of urine: Secondary | ICD-10-CM | POA: Diagnosis not present

## 2017-12-30 DIAGNOSIS — N390 Urinary tract infection, site not specified: Secondary | ICD-10-CM | POA: Diagnosis not present

## 2017-12-30 DIAGNOSIS — N393 Stress incontinence (female) (male): Secondary | ICD-10-CM | POA: Diagnosis not present

## 2018-01-30 DIAGNOSIS — Z86010 Personal history of colonic polyps: Secondary | ICD-10-CM | POA: Diagnosis not present

## 2018-01-30 DIAGNOSIS — G479 Sleep disorder, unspecified: Secondary | ICD-10-CM | POA: Diagnosis not present

## 2018-01-30 DIAGNOSIS — Z0000 Encounter for general adult medical examination without abnormal findings: Secondary | ICD-10-CM | POA: Diagnosis not present

## 2018-01-30 DIAGNOSIS — Z13220 Encounter for screening for lipoid disorders: Secondary | ICD-10-CM | POA: Diagnosis not present

## 2018-04-07 DIAGNOSIS — J301 Allergic rhinitis due to pollen: Secondary | ICD-10-CM | POA: Diagnosis not present

## 2018-04-10 DIAGNOSIS — J301 Allergic rhinitis due to pollen: Secondary | ICD-10-CM | POA: Diagnosis not present

## 2018-05-03 DIAGNOSIS — J324 Chronic pansinusitis: Secondary | ICD-10-CM | POA: Diagnosis not present

## 2018-08-16 DIAGNOSIS — J301 Allergic rhinitis due to pollen: Secondary | ICD-10-CM | POA: Diagnosis not present

## 2018-08-24 DIAGNOSIS — J301 Allergic rhinitis due to pollen: Secondary | ICD-10-CM | POA: Diagnosis not present

## 2018-09-06 DIAGNOSIS — J019 Acute sinusitis, unspecified: Secondary | ICD-10-CM | POA: Diagnosis not present

## 2018-11-17 DIAGNOSIS — H5213 Myopia, bilateral: Secondary | ICD-10-CM | POA: Diagnosis not present

## 2018-11-27 DIAGNOSIS — Z01419 Encounter for gynecological examination (general) (routine) without abnormal findings: Secondary | ICD-10-CM | POA: Diagnosis not present

## 2018-11-27 DIAGNOSIS — Z6824 Body mass index (BMI) 24.0-24.9, adult: Secondary | ICD-10-CM | POA: Diagnosis not present

## 2018-11-27 DIAGNOSIS — Z1231 Encounter for screening mammogram for malignant neoplasm of breast: Secondary | ICD-10-CM | POA: Diagnosis not present

## 2018-11-30 DIAGNOSIS — J309 Allergic rhinitis, unspecified: Secondary | ICD-10-CM | POA: Diagnosis not present

## 2018-12-22 DIAGNOSIS — J301 Allergic rhinitis due to pollen: Secondary | ICD-10-CM | POA: Diagnosis not present

## 2019-01-02 DIAGNOSIS — N3020 Other chronic cystitis without hematuria: Secondary | ICD-10-CM | POA: Diagnosis not present

## 2019-01-02 DIAGNOSIS — R338 Other retention of urine: Secondary | ICD-10-CM | POA: Diagnosis not present

## 2019-01-02 DIAGNOSIS — N393 Stress incontinence (female) (male): Secondary | ICD-10-CM | POA: Diagnosis not present

## 2019-02-15 DIAGNOSIS — Z0001 Encounter for general adult medical examination with abnormal findings: Secondary | ICD-10-CM | POA: Diagnosis not present

## 2019-02-21 DIAGNOSIS — G479 Sleep disorder, unspecified: Secondary | ICD-10-CM | POA: Diagnosis not present

## 2019-02-21 DIAGNOSIS — Z79899 Other long term (current) drug therapy: Secondary | ICD-10-CM | POA: Diagnosis not present

## 2019-04-18 ENCOUNTER — Other Ambulatory Visit: Payer: Self-pay

## 2019-04-18 DIAGNOSIS — Z20822 Contact with and (suspected) exposure to covid-19: Secondary | ICD-10-CM

## 2019-04-22 LAB — NOVEL CORONAVIRUS, NAA: SARS-CoV-2, NAA: NOT DETECTED

## 2019-04-23 ENCOUNTER — Telehealth: Payer: Self-pay | Admitting: General Practice

## 2019-04-23 NOTE — Telephone Encounter (Signed)
Patient informed of negative covid-19 result. Patient verbalized understanding.  °

## 2019-09-14 ENCOUNTER — Other Ambulatory Visit: Payer: Self-pay

## 2019-09-14 DIAGNOSIS — Z20822 Contact with and (suspected) exposure to covid-19: Secondary | ICD-10-CM

## 2019-09-16 LAB — NOVEL CORONAVIRUS, NAA: SARS-CoV-2, NAA: NOT DETECTED

## 2021-03-06 ENCOUNTER — Other Ambulatory Visit (HOSPITAL_COMMUNITY): Payer: Self-pay

## 2021-03-06 MED ORDER — ALPRAZOLAM 0.25 MG PO TABS
ORAL_TABLET | ORAL | 0 refills | Status: AC
  Filled 2021-03-06: qty 10, 5d supply, fill #0

## 2022-01-06 ENCOUNTER — Other Ambulatory Visit: Payer: Self-pay | Admitting: Internal Medicine

## 2022-01-06 ENCOUNTER — Ambulatory Visit
Admission: RE | Admit: 2022-01-06 | Discharge: 2022-01-06 | Disposition: A | Discharge status: Home / Self Care | Payer: 59 | Source: Ambulatory Visit | Attending: Internal Medicine | Admitting: Internal Medicine

## 2022-01-06 DIAGNOSIS — M5442 Lumbago with sciatica, left side: Secondary | ICD-10-CM

## 2022-03-24 ENCOUNTER — Other Ambulatory Visit: Payer: Self-pay | Admitting: Internal Medicine

## 2022-03-24 ENCOUNTER — Other Ambulatory Visit (HOSPITAL_COMMUNITY): Payer: Self-pay | Admitting: Internal Medicine

## 2022-03-24 DIAGNOSIS — R945 Abnormal results of liver function studies: Secondary | ICD-10-CM

## 2022-03-26 ENCOUNTER — Ambulatory Visit (HOSPITAL_COMMUNITY)
Admission: RE | Admit: 2022-03-26 | Discharge: 2022-03-26 | Disposition: A | Discharge status: Home / Self Care | Payer: 59 | Source: Ambulatory Visit | Attending: Internal Medicine | Admitting: Internal Medicine

## 2022-03-26 ENCOUNTER — Other Ambulatory Visit: Payer: Self-pay | Admitting: Internal Medicine

## 2022-03-26 DIAGNOSIS — R945 Abnormal results of liver function studies: Secondary | ICD-10-CM

## 2022-03-30 ENCOUNTER — Ambulatory Visit (HOSPITAL_COMMUNITY)
Admission: RE | Admit: 2022-03-30 | Discharge: 2022-03-30 | Disposition: A | Discharge status: Home / Self Care | Payer: 59 | Source: Ambulatory Visit | Attending: Internal Medicine | Admitting: Internal Medicine

## 2022-03-30 ENCOUNTER — Encounter (HOSPITAL_COMMUNITY): Payer: Self-pay

## 2022-03-30 DIAGNOSIS — R945 Abnormal results of liver function studies: Secondary | ICD-10-CM | POA: Diagnosis present

## 2022-03-30 MED ORDER — IOHEXOL 350 MG/ML SOLN
100.0000 mL | Freq: Once | INTRAVENOUS | Status: AC | PRN
  Administered 2022-03-30: 100 mL via INTRAVENOUS

## 2022-04-01 ENCOUNTER — Other Ambulatory Visit: Payer: 59

## 2022-04-09 ENCOUNTER — Other Ambulatory Visit: Payer: Self-pay | Admitting: Internal Medicine

## 2022-04-09 DIAGNOSIS — R748 Abnormal levels of other serum enzymes: Secondary | ICD-10-CM

## 2022-04-22 ENCOUNTER — Ambulatory Visit
Admission: RE | Admit: 2022-04-22 | Discharge: 2022-04-22 | Disposition: A | Discharge status: Home / Self Care | Payer: 59 | Source: Ambulatory Visit | Attending: Internal Medicine | Admitting: Internal Medicine

## 2022-04-22 DIAGNOSIS — R748 Abnormal levels of other serum enzymes: Secondary | ICD-10-CM

## 2022-04-22 MED ORDER — GADOBENATE DIMEGLUMINE 529 MG/ML IV SOLN
11.0000 mL | Freq: Once | INTRAVENOUS | Status: AC | PRN
  Administered 2022-04-22: 11 mL via INTRAVENOUS

## 2022-11-09 ENCOUNTER — Other Ambulatory Visit: Payer: Self-pay | Admitting: Neurological Surgery

## 2022-11-09 DIAGNOSIS — M5416 Radiculopathy, lumbar region: Secondary | ICD-10-CM

## 2023-07-03 IMAGING — CR DG LUMBAR SPINE 2-3V
3 series · 3 of 3 positions shown · non-contrast
Comparison: X-ray 06/03/2014; MRI 03/15/2009.

CLINICAL DATA: Acute left-sided low back pain, left leg pain x
several weeks.

EXAM:
LUMBAR SPINE - 2-3 VIEW

[t l-spine a.p.]
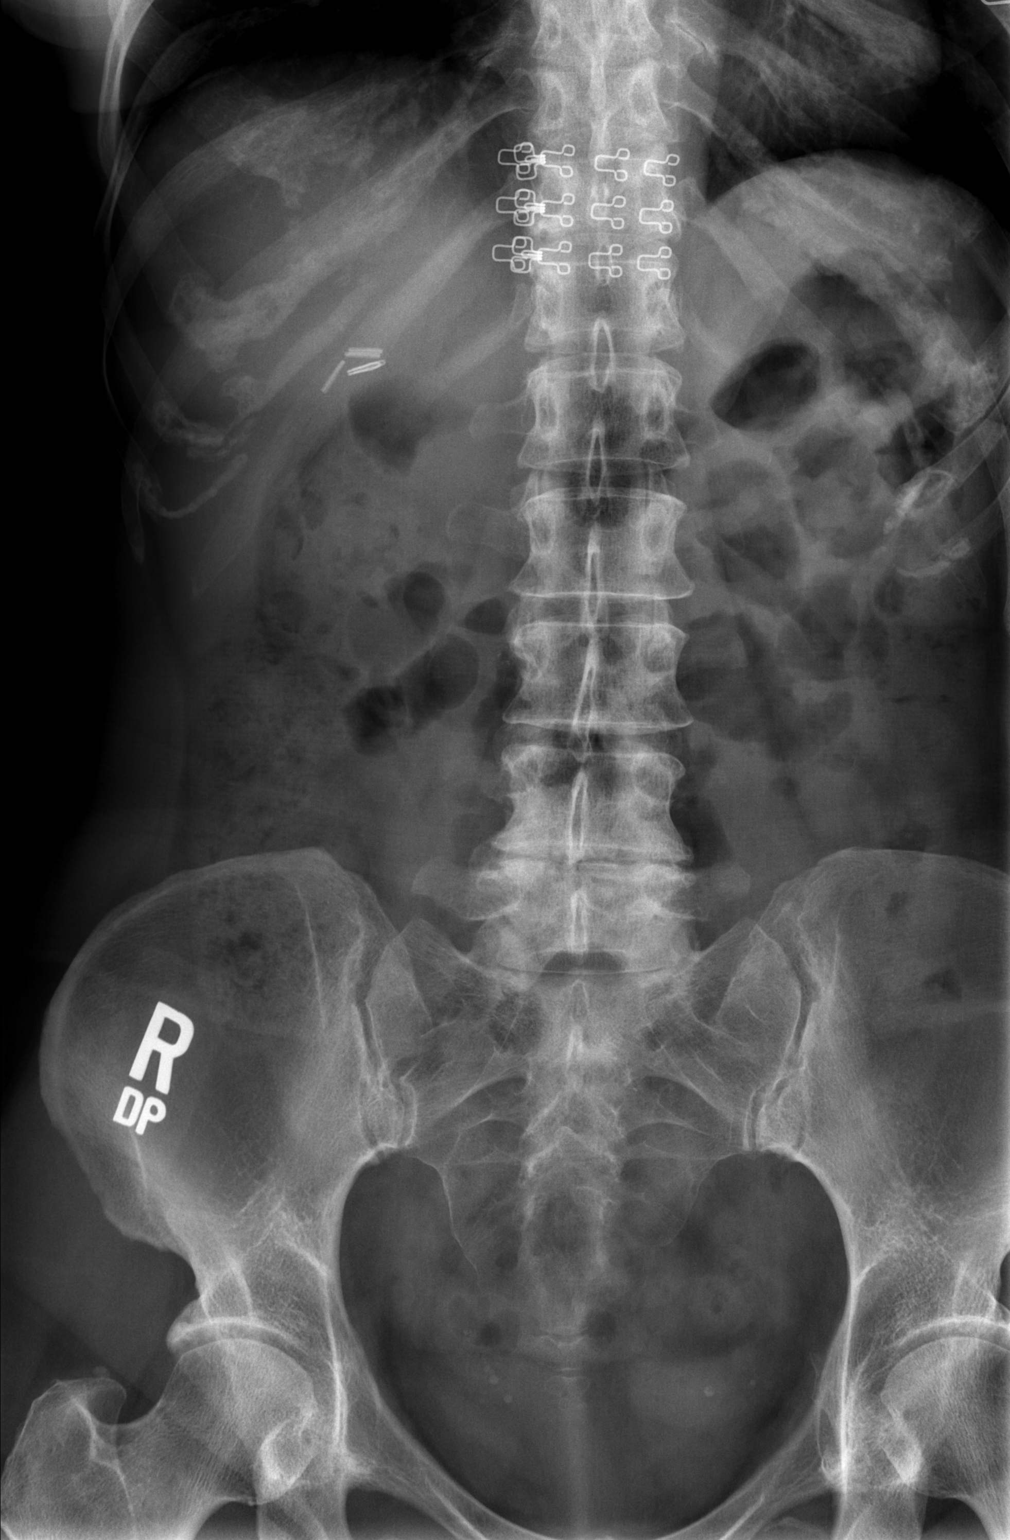

[t l-spine lat]
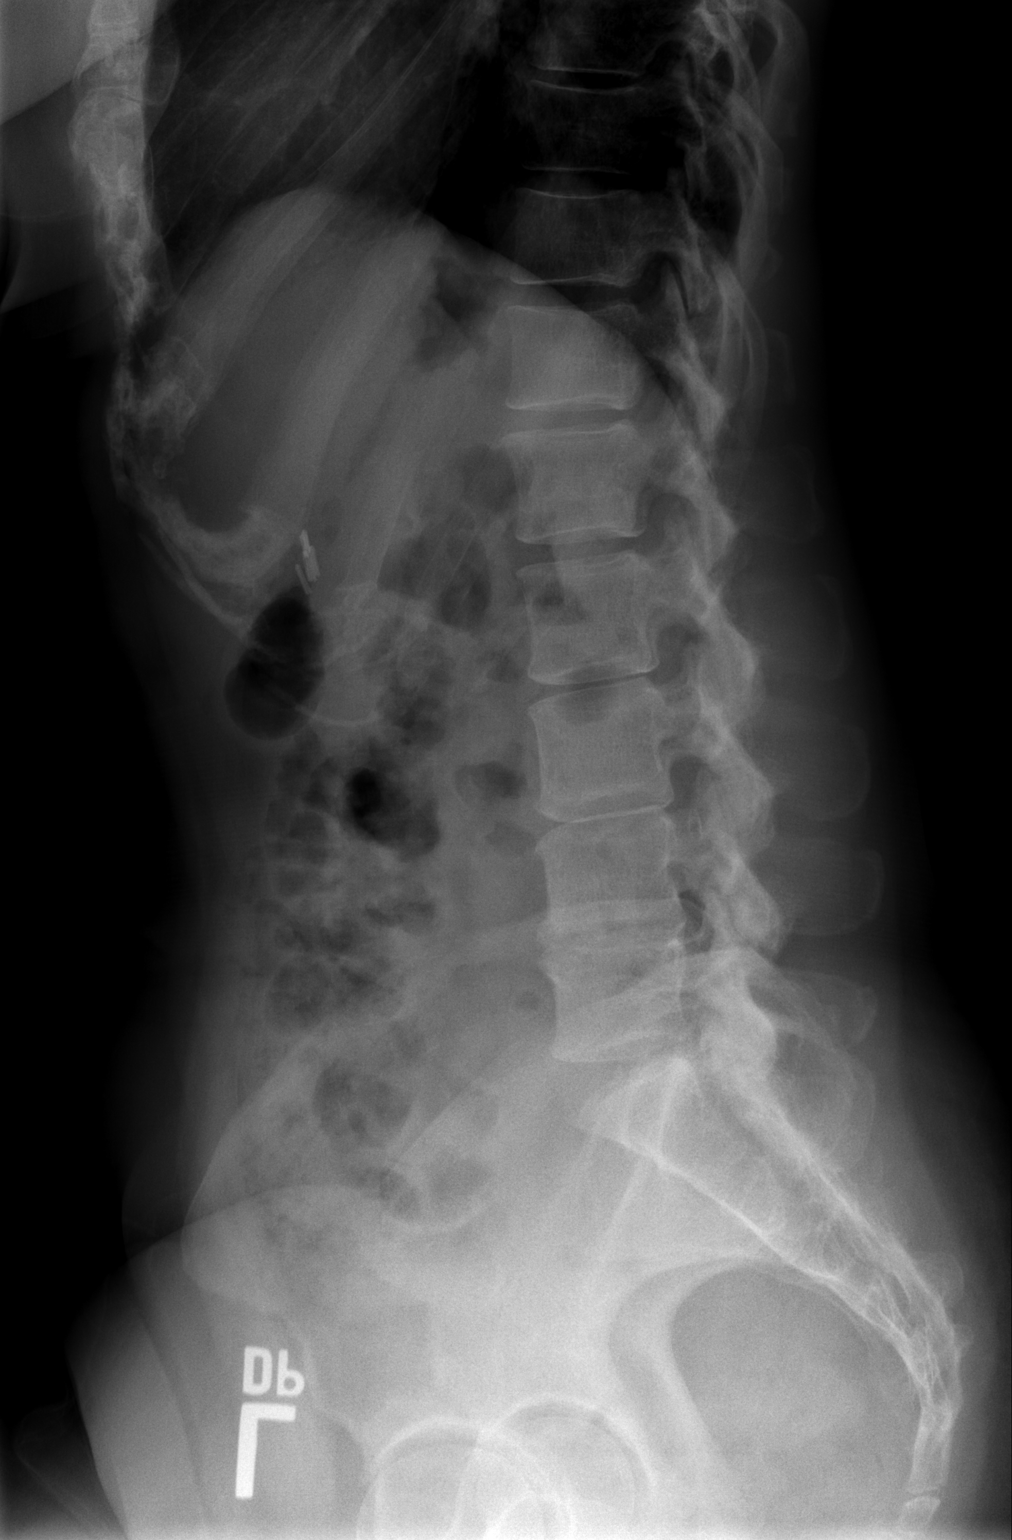

[t l-spine l5-s1 spot]
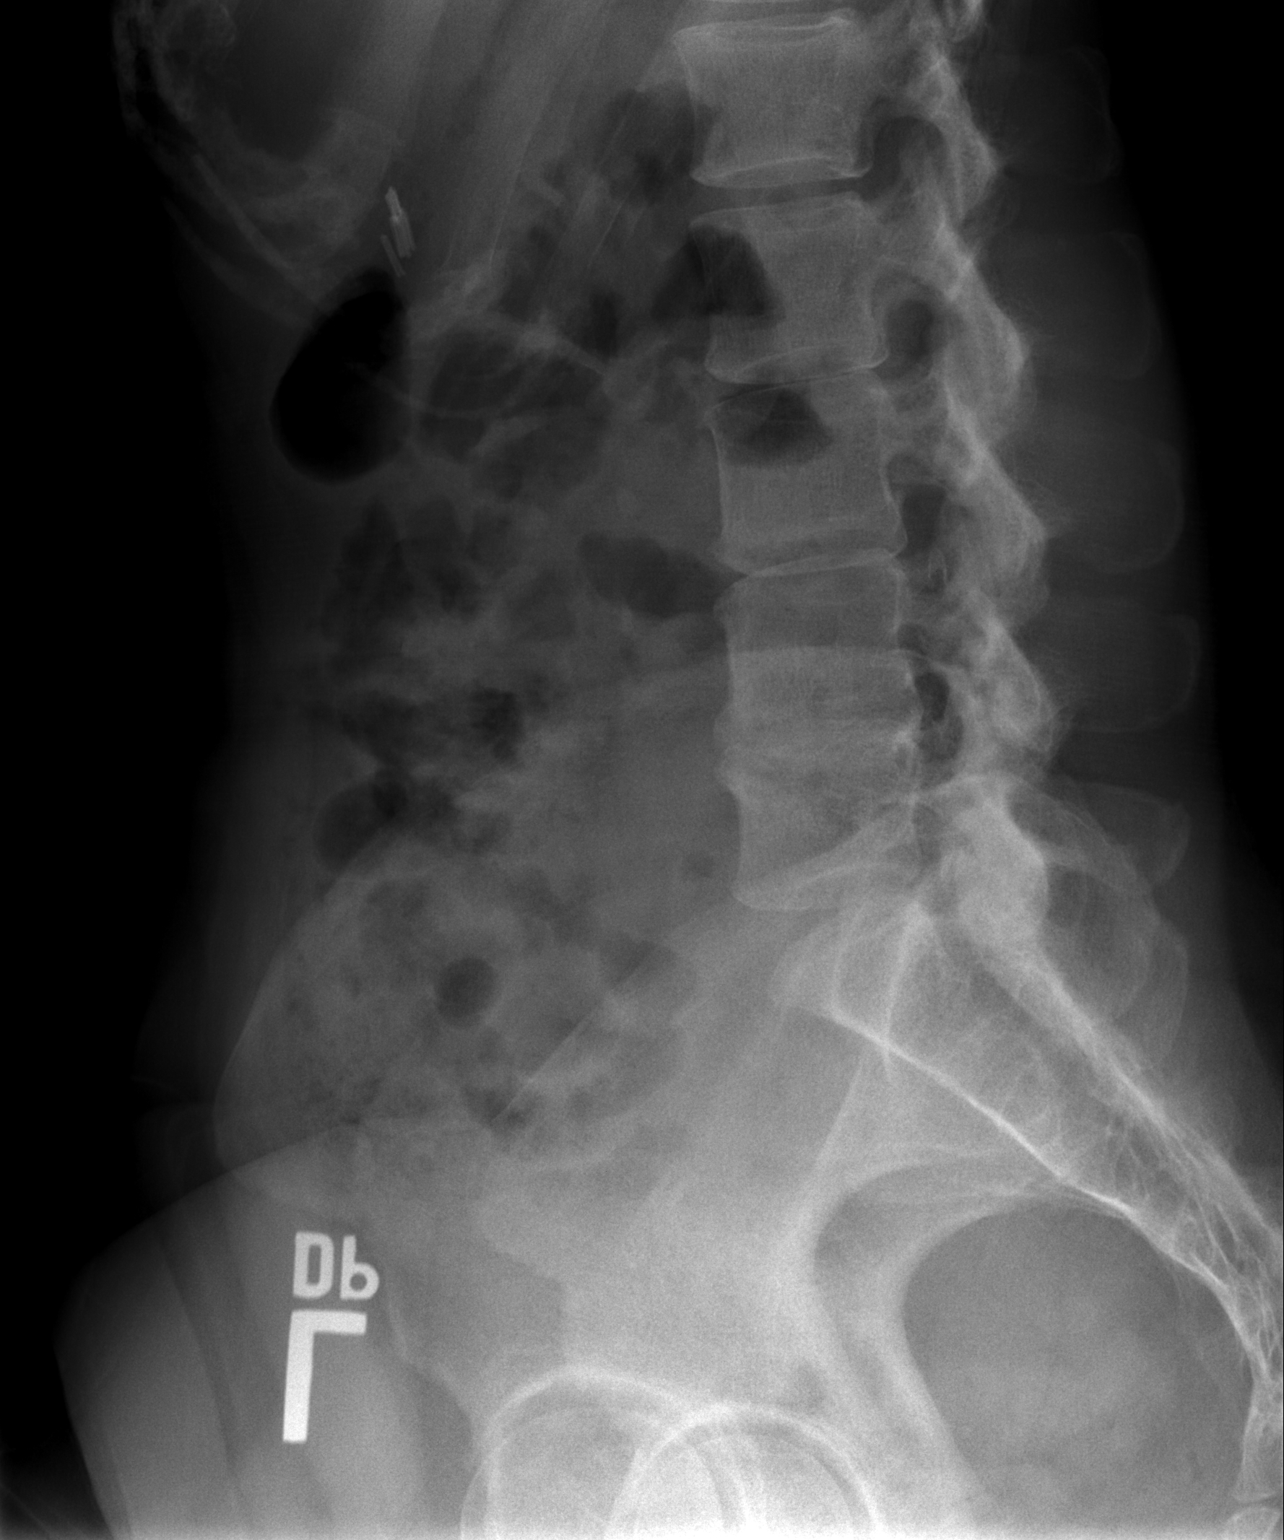

[3 of 3 positions shown; findings below may reference images not displayed]

FINDINGS: Mild straightening of the lumbar spine. No acute fracture or
listhesis. Vertebral body height has been preserved. There is
intervertebral disc space narrowing and endplate remodeling at L4-5
and to a lesser extent L3-4 in keeping with changes of mild to
moderate degenerative disc disease. Changes at L4-5 appear slightly
progressive since prior examination. Paraspinal soft tissues are
unremarkable. Cholecystectomy clips seen in the right upper
quadrant.
IMPRESSION: Degenerative changes as outlined above. No acute fracture or
listhesis.

## 2023-09-23 ENCOUNTER — Telehealth: Payer: 59 | Admitting: Physician Assistant

## 2023-09-23 DIAGNOSIS — M549 Dorsalgia, unspecified: Secondary | ICD-10-CM | POA: Diagnosis not present

## 2023-09-23 MED ORDER — CYCLOBENZAPRINE HCL 10 MG PO TABS: 5.0000 mg | ORAL_TABLET | Freq: Three times a day (TID) | ORAL | 0 refills | Status: AC | PRN

## 2023-09-23 MED ORDER — NAPROXEN 500 MG PO TABS: 500.0000 mg | ORAL_TABLET | Freq: Two times a day (BID) | ORAL | 0 refills | Status: AC

## 2023-09-23 NOTE — Progress Notes (Signed)

## 2023-11-07 ENCOUNTER — Other Ambulatory Visit (HOSPITAL_COMMUNITY): Payer: Self-pay | Admitting: Neurological Surgery

## 2023-11-07 DIAGNOSIS — M5416 Radiculopathy, lumbar region: Secondary | ICD-10-CM
